# Patient Record
Sex: Female | Born: 2000 | Race: Black or African American | Hispanic: No | Marital: Single | Smoking: Never smoker
Health system: Southern US, Community
[De-identification: ages and names within clinical notes are randomized; demographics above are authoritative.]

## PROBLEM LIST (undated history)

## (undated) ENCOUNTER — Emergency Department (HOSPITAL_COMMUNITY): Admission: EM | Payer: Medicaid Other | Source: Home / Self Care

## (undated) ENCOUNTER — Ambulatory Visit (HOSPITAL_COMMUNITY): Discharge status: Absent without leave | Payer: Medicaid Other

## (undated) ENCOUNTER — Ambulatory Visit

## (undated) DIAGNOSIS — J45909 Unspecified asthma, uncomplicated: Secondary | ICD-10-CM

## (undated) DIAGNOSIS — N926 Irregular menstruation, unspecified: Secondary | ICD-10-CM

## (undated) DIAGNOSIS — L732 Hidradenitis suppurativa: Secondary | ICD-10-CM

## (undated) DIAGNOSIS — K219 Gastro-esophageal reflux disease without esophagitis: Secondary | ICD-10-CM

## (undated) HISTORY — PX: NO PAST SURGERIES: SHX2092

---
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
## 2021-01-01 ENCOUNTER — Telehealth: Payer: Self-pay | Admitting: Obstetrics and Gynecology

## 2021-01-01 NOTE — Telephone Encounter (Signed)
I called Judith [name] today at 3:23 PM and confirmed patient's identity using two patient identifiers. [place] results from earlier today were reviewed. First trimester warning signs reviewed. Patient voiced understanding and had no further questions.   No results found.  Duane Lope, NP 01/01/2021 3:23 PM

## 2021-01-07 ENCOUNTER — Emergency Department (HOSPITAL_COMMUNITY)
Admission: EM | Admit: 2021-01-07 | Discharge: 2021-01-07 | Disposition: A | Discharge status: Home / Self Care | Payer: Medicaid Other | Attending: Emergency Medicine | Admitting: Emergency Medicine

## 2021-01-07 ENCOUNTER — Encounter (HOSPITAL_COMMUNITY): Payer: Self-pay

## 2021-01-07 ENCOUNTER — Other Ambulatory Visit: Payer: Self-pay

## 2021-01-07 DIAGNOSIS — N9489 Other specified conditions associated with female genital organs and menstrual cycle: Secondary | ICD-10-CM | POA: Diagnosis not present

## 2021-01-07 DIAGNOSIS — Z3A09 9 weeks gestation of pregnancy: Secondary | ICD-10-CM | POA: Diagnosis not present

## 2021-01-07 DIAGNOSIS — O219 Vomiting of pregnancy, unspecified: Secondary | ICD-10-CM | POA: Diagnosis not present

## 2021-01-07 LAB — COMPREHENSIVE METABOLIC PANEL
ALT: 11 U/L (ref 0–44)
AST: 16 U/L (ref 15–41)
Albumin: 3.8 g/dL (ref 3.5–5.0)
Alkaline Phosphatase: 59 U/L (ref 38–126)
Anion gap: 15 (ref 5–15)
BUN: 12 mg/dL (ref 6–20)
CO2: 21 mmol/L — ABNORMAL LOW (ref 22–32)
Calcium: 9.9 mg/dL (ref 8.9–10.3)
Chloride: 102 mmol/L (ref 98–111)
Creatinine, Ser: 0.7 mg/dL (ref 0.44–1.00)
GFR, Estimated: >60 mL/min (ref >60)
Glucose, Bld: 86 mg/dL (ref 70–99)
Potassium: 3.5 mmol/L (ref 3.5–5.1)
Sodium: 138 mmol/L (ref 135–145)
Total Bilirubin: 0.9 mg/dL (ref 0.3–1.2)
Total Protein: 8.5 g/dL — ABNORMAL HIGH (ref 6.5–8.1)

## 2021-01-07 LAB — CBC WITH DIFFERENTIAL/PLATELET
Abs Immature Granulocytes: 0.02 10*3/uL (ref 0.00–0.07)
Basophils Absolute: 0 10*3/uL (ref 0.0–0.1)
Basophils Relative: 0 %
Eosinophils Absolute: 0 10*3/uL (ref 0.0–0.5)
Eosinophils Relative: 0 %
HCT: 38.9 % (ref 36.0–46.0)
Hemoglobin: 13.1 g/dL (ref 12.0–15.0)
Immature Granulocytes: 0 %
Lymphocytes Relative: 21 %
Lymphs Abs: 1.4 10*3/uL (ref 0.7–4.0)
MCH: 28.5 pg (ref 26.0–34.0)
MCHC: 33.7 g/dL (ref 30.0–36.0)
MCV: 84.7 fL (ref 80.0–100.0)
Monocytes Absolute: 0.6 10*3/uL (ref 0.1–1.0)
Monocytes Relative: 9 %
Neutro Abs: 4.5 10*3/uL (ref 1.7–7.7)
Neutrophils Relative %: 70 %
Platelets: 330 10*3/uL (ref 150–400)
RBC: 4.59 MIL/uL (ref 3.87–5.11)
RDW: 13.9 % (ref 11.5–15.5)
WBC: 6.5 10*3/uL (ref 4.0–10.5)
nRBC: 0 % (ref 0.0–0.2)

## 2021-01-07 LAB — HCG, QUANTITATIVE, PREGNANCY: hCG, Beta Chain, Quant, S: 305472 m[IU]/mL — ABNORMAL HIGH (ref <5)

## 2021-01-07 MED ORDER — ONDANSETRON HCL 4 MG/2ML IJ SOLN
4.0000 mg | Freq: Once | INTRAMUSCULAR | Status: AC
Start: 2021-01-07 — End: 2021-01-07
  Administered 2021-01-07: 4 mg via INTRAVENOUS
  Filled 2021-01-07: qty 2

## 2021-01-07 MED ORDER — DOXYLAMINE-PYRIDOXINE 10-10 MG PO TBEC: 10.0000 mg | DELAYED_RELEASE_TABLET | Freq: Every day | ORAL | 0 refills | Status: AC

## 2021-01-07 MED ORDER — SODIUM CHLORIDE 0.9 % IV BOLUS
1000.0000 mL | Freq: Once | INTRAVENOUS | Status: AC
  Administered 2021-01-07: 1000 mL via INTRAVENOUS

## 2021-01-07 MED ORDER — SODIUM CHLORIDE 0.9 % IV BOLUS
1000.0000 mL | Freq: Once | INTRAVENOUS | Status: AC
Start: 2021-01-07 — End: 2021-01-07
  Administered 2021-01-07: 1000 mL via INTRAVENOUS

## 2021-01-07 MED ORDER — SODIUM CHLORIDE 0.9 % IV SOLN
12.5000 mg | Freq: Four times a day (QID) | INTRAVENOUS | Status: DC | PRN
  Administered 2021-01-07: 12.5 mg via INTRAVENOUS
  Filled 2021-01-07: qty 0.5
  Filled 2021-01-07: qty 12.5

## 2021-01-07 NOTE — ED Provider Notes (Signed)
Emergency Medicine Provider Triage Evaluation Note  Judith [name] , a 20 y.o. G1 T0 P0 A0 L0 female who is [redacted] weeks pregnant was evaluated in triage.  Pt complains of intermittent nausea and vomiting.  She states that her symptoms started about 2 weeks ago.  States she has had very poor p.o. intake due to this and has been losing weight.  Reports intermittent pelvic cramping but denies any significant pelvic pain.  No vaginal discharge or vaginal bleeding.  Patient states that she had an ultrasound last week but otherwise does not have an OB/GYN.  Findings of her ultrasound as noted below.  IMPRESSION: Single living IUP with estimated gestational age of [redacted] weeks 1 day, and [place] EDC of 08/11/2021.   Small subchorionic hemorrhage.  Physical Exam  BP 118/85 (BP Location: Left Arm)   Pulse (!) 112   Temp 99 F (37.2 C) (Oral)   Resp 18   SpO2 100%  Gen:   Awake, no distress   Resp:  Normal effort  MSK:   Moves extremities without difficulty  Other:    Medical Decision Making  Medically screening exam initiated at 12:50 PM.  Appropriate orders placed.  Cassidie Veiga was informed that the remainder of the evaluation will be completed by another provider, this initial triage assessment does not replace that evaluation, and the importance of remaining in the ED until their evaluation is complete.   Placido Sou, PA-C 01/07/21 1252    Gerhard Munch, MD 01/08/21 1450

## 2021-01-07 NOTE — ED Notes (Signed)
Pt given 3 cans of ginger ale without emesis. Pt given [place] sandwich for PO challenge post- phenergan administration

## 2021-01-07 NOTE — ED Notes (Signed)
Pt denies N/V after consumption of [place] sandwich

## 2021-01-07 NOTE — ED Provider Notes (Signed)
[facility]-EMERGENCY DEPT Provider Note   CSN: [id number] Arrival date & time: 01/07/21  1229     History Chief Complaint  Patient presents with   Emesis    Judith [name] is a 20 y.o. female with a past medical history significant for hidradenitis suppurativa who presents to the ED due to nausea and vomiting x2 weeks.  Patient is roughly [redacted] weeks pregnant (per patient) with her first pregnancy.  Patient states she is unable to tolerate any p.o.  She admits to numerous episodes of nonbilious emesis daily with specks of blood.  Denies associated abdominal pain.  No vaginal bleeding.  She had a ultrasound performed on 12/31/2020 which confirmed IUP with estimated gestational age of [redacted] weeks 1 day. EDD 08/11/2021.  It also demonstrated a small subchorionic hemorrhage.  Patient denies urinary symptoms.  No diarrhea.  She has not tried thing for symptoms.  History obtained from patient and past medical records. No interpreter used during encounter.       Past Medical History:  Diagnosis Date   Hidradenitis suppurativa    Irregular periods     There are no problems to display for this patient.   History reviewed. No pertinent surgical history.   OB History     Gravida  1   Para      Term      Preterm      AB      Living         SAB      IAB      Ectopic      Multiple      Live Births              Family History  Problem Relation Age of Onset   Asthma Mother    Diabetes Mother    Kidney failure Father     Social History   Tobacco Use   Smoking status: Never   Smokeless tobacco: Never  Vaping Use   Vaping Use: Never used  Substance Use Topics   Alcohol use: No   Drug use: No    Home Medications Prior to Admission medications   Medication Sig Start Date End Date Taking? Authorizing Provider  Doxylamine-Pyridoxine 10-10 MG TBEC Take 10 mg by mouth at bedtime. 01/07/21 02/06/21 Yes Jaquana Geiger, Merla Riches, PA-C  cephALEXin (KEFLEX)  500 MG capsule Take 1 capsule (500 mg total) by mouth 2 (two) times daily. 12/14/20   Mardella Layman, MD    Allergies    Patient has no known allergies.  Review of Systems   Review of Systems  Constitutional:  Negative for chills and fever.  Respiratory:  Negative for shortness of breath.   Cardiovascular:  Negative for chest pain.  Gastrointestinal:  Positive for nausea and vomiting. Negative for abdominal pain and diarrhea.  Genitourinary:  Negative for dysuria, vaginal bleeding and vaginal discharge.  All other systems reviewed and are negative.  Physical Exam Updated Vital Signs BP (!) 99/58 (BP Location: Left Arm)   Pulse 80   Temp 99 F (37.2 C) (Oral)   Resp 16   Ht 4\' 11"  (1.499 m)   Wt 65.8 kg   LMP 11/11/2020 (Exact Date)   SpO2 100%   BMI 29.29 kg/m   Physical Exam Vitals and nursing note reviewed.  Constitutional:      General: She is not in acute distress.    Appearance: She is not ill-appearing.  HENT:     Head: Normocephalic.  Eyes:     Pupils: Pupils are equal, round, and reactive to light.  Cardiovascular:     Rate and Rhythm: Normal rate and regular rhythm.     Pulses: Normal pulses.     Heart sounds: Normal heart sounds. No murmur heard.   No friction rub. No gallop.  Pulmonary:     Effort: Pulmonary effort is normal.     Breath sounds: Normal breath sounds.  Abdominal:     General: Abdomen is flat. There is no distension.     Palpations: Abdomen is soft.     Tenderness: There is no abdominal tenderness. There is no guarding or rebound.     Comments: Abdomen soft, nondistended, nontender to palpation in all quadrants without guarding or peritoneal signs. No rebound.   Musculoskeletal:        General: Normal range of motion.     Cervical back: Neck supple.  Skin:    General: Skin is warm and dry.  Neurological:     General: No focal deficit present.     Mental Status: She is alert.  Psychiatric:        Mood and Affect: Mood normal.         Behavior: Behavior normal.    ED Results / Procedures / Treatments   Labs (all labs ordered are listed, but only abnormal results are displayed) Labs Reviewed  COMPREHENSIVE METABOLIC PANEL - Abnormal; Notable for the following components:      Result Value   CO2 21 (*)    Total Protein 8.5 (*)    All other components within normal limits  HCG, QUANTITATIVE, PREGNANCY - Abnormal; Notable for the following components:   hCG, Beta Chain, Quant, [place] [phone] (*)    All other components within normal limits  CBC WITH DIFFERENTIAL/PLATELET  URINALYSIS, ROUTINE W REFLEX MICROSCOPIC    EKG None  Radiology No results found.  Procedures Procedures   Medications Ordered in ED Medications  promethazine (PHENERGAN) 12.5 mg in sodium chloride 0.9 % 50 mL IVPB (0 mg Intravenous Stopped 01/07/21 1841)  sodium chloride 0.9 % bolus 1,000 mL (0 mLs Intravenous Stopped 01/07/21 1801)  ondansetron (ZOFRAN) injection 4 mg (4 mg Intravenous Given 01/07/21 1546)  sodium chloride 0.9 % bolus 1,000 mL (1,000 mLs Intravenous New Bag/Given 01/07/21 1804)    ED Course  I have reviewed the triage vital signs and the nursing notes.  Pertinent labs & imaging results that were available during my care of the patient were reviewed by me and considered in my medical decision making (see chart for details).  Clinical Course as of 01/07/21 1849  Tue Jan 07, 2021  1535 HCG, Newman Nickels(!): [phone] [CA]    Clinical Course User Index [CA] Mannie Stabile, PA-C   MDM Rules/Calculators/A&P                           20 year old G1P0 female at roughly [redacted] weeks gestation per [place] presents to the ED due to nausea vomiting x2 weeks.  No abdominal pain.  Patient denies vaginal bleeding.  Upon arrival, patient afebrile, tachycardic at 112 with otherwise unremarkable vitals.  During my evaluation, patient's heart rate in the 90s.  Patient nontoxic-appearing.  Physical exam reassuring.  Abdomen soft, nondistended,  nontender.  Low suspicion for acute abdomen.  Labs ordered in triage.  IV fluids and Zofran given.  CBC unremarkable with no leukocytosis and normal hemoglobin.  CMP reassuring  with normal renal function and no major electrolyte derangements.  Hcg [id number] consistent with pregnancy. Unfortunately, urine spilled out in lab and unable to obtain anymore.  Patient denies any urinary symptoms.  Upon reassessment, patient admits to improvement in symptoms.  Patient eating a [place] sandwich at bedside without difficulty.  Patient discharged with symptomatic treatment.  Advised patient to establish care with an OB/GYN for further evaluation. Strict ED precautions discussed with patient. Patient states understanding and agrees to plan. Patient discharged home in no acute distress and stable vitals  Final Clinical Impression(s) / ED Diagnoses Final diagnoses:  Nausea and vomiting in pregnancy  [redacted] weeks gestation of pregnancy    Rx / DC Orders ED Discharge Orders          Ordered    Doxylamine-Pyridoxine 10-10 MG TBEC  Daily at bedtime        01/07/21 1807             Jesusita Oka 01/07/21 1849    Jacalyn Lefevre, MD 01/07/21 2153

## 2021-01-07 NOTE — Discharge Instructions (Addendum)
It was a pleasure taking care of you today.  As discussed, I am sending you home with nausea medication.  Take nightly.  I have included the number of the OB/GYN.  Please call tomorrow to schedule an appointment to establish care.  Return to the ER for new or worsening symptoms.

## 2021-01-07 NOTE — ED Triage Notes (Signed)
Patient reports that she is [redacted] weeks pregnant and has been vomiting for the past 2 weeks daily throughout each day.

## 2021-07-18 ENCOUNTER — Other Ambulatory Visit: Payer: Self-pay

## 2021-07-18 ENCOUNTER — Encounter (HOSPITAL_COMMUNITY): Payer: Self-pay | Admitting: Emergency Medicine

## 2021-07-18 ENCOUNTER — Ambulatory Visit (HOSPITAL_COMMUNITY)
Admission: EM | Admit: 2021-07-18 | Discharge: 2021-07-18 | Disposition: A | Discharge status: Home / Self Care | Payer: Medicaid Other | Attending: Nurse Practitioner | Admitting: Nurse Practitioner

## 2021-07-18 DIAGNOSIS — R350 Frequency of micturition: Secondary | ICD-10-CM | POA: Insufficient documentation

## 2021-07-18 DIAGNOSIS — R1011 Right upper quadrant pain: Secondary | ICD-10-CM | POA: Insufficient documentation

## 2021-07-18 LAB — POCT URINALYSIS DIPSTICK, ED / UC
Bilirubin Urine: NEGATIVE
Glucose, UA: NEGATIVE mg/dL
Ketones, ur: NEGATIVE mg/dL
Leukocytes,Ua: NEGATIVE
Nitrite: NEGATIVE
Protein, ur: NEGATIVE mg/dL
Specific Gravity, Urine: 1.02 (ref 1.005–1.030)
Urobilinogen, UA: 0.2 mg/dL (ref 0.0–1.0)
pH: 7 (ref 5.0–8.0)

## 2021-07-18 LAB — POC URINE PREG, ED: Preg Test, Ur: NEGATIVE

## 2021-07-18 MED ORDER — IBUPROFEN 600 MG PO TABS: 600.0000 mg | ORAL_TABLET | Freq: Three times a day (TID) | ORAL | 0 refills | Status: AC | PRN

## 2021-07-18 NOTE — ED Triage Notes (Signed)
Pt states she recently came off her menstrual cycle and now has abdominal cramping similar to menstrual pains. States she now has urinary frequency and increased pressure in the bladder x 3days.  ?

## 2021-07-18 NOTE — ED Provider Notes (Signed)
?[facility] ? ? ? ?CSN: [id number] ?Arrival date & time: 07/18/21  1354 ? ? ?  ? ?History   ?Chief Complaint ?Chief Complaint  ?Patient presents with  ? Abdominal Pain  ? Urinary Frequency  ? ? ?HPI ?Judith [name] is a 21 y.o. female.  ? ?The patient is a 21 year old female who presents for urinary symptoms and abdominal cramping.  The patient states that ramping started about 3 days ago.  Her last menstrual cycle was on 07/09/2021, lasted about 4 days, and she reports it was normal.  Over the past 24 hours, she has developed urinary frequency, and suprapubic pressure with urination.  She also complains of abdominal cramping.  She states the cramping feels like "menstrual" cramps.  She states that they have been pretty frequent, and have not improved with the use of ibuprofen.  She denies fever, chills, low back pain, urgency, hesitancy, or hematuria.  She would also like to have STI testing done today.  She is sexually active with 1 female in the past 90 days, 0% condom use.  She denies any previous history of STI. ? ? ?Abdominal Pain ?Pain location:  RUQ ?Pain quality: cramping   ?Pain radiates to:  Does not radiate ?Timing:  Intermittent ?Progression:  Waxing and waning ?Context: recent sexual activity   ?Context: not diet changes   ?Relieved by:  Nothing ?Associated symptoms: no chills, no constipation, no nausea, no vaginal bleeding and no vaginal discharge   ?Urinary Frequency ?Associated symptoms include abdominal pain.  ? ?Past Medical History:  ?Diagnosis Date  ? Hidradenitis suppurativa   ? Irregular periods   ? ? ?There are no problems to display for this patient. ? ? ?History reviewed. No pertinent surgical history. ? ?OB History   ? ? Gravida  ?1  ? Para  ?   ? Term  ?   ? Preterm  ?   ? AB  ?   ? Living  ?   ?  ? ? SAB  ?   ? IAB  ?   ? Ectopic  ?   ? Multiple  ?   ? Live Births  ?   ?   ?  ?  ? ? ? ?Home Medications   ? ?Prior to Admission medications   ?Medication Sig Start Date End Date  Taking? Authorizing Provider  ?ibuprofen (ADVIL) 600 MG tablet Take 1 tablet (600 mg total) by mouth every 8 (eight) hours as needed for up to 10 days. 07/18/21 07/28/21 Yes Kaveri Perras-Warren, Alda Lea, NP  ?cephALEXin (KEFLEX) 500 MG capsule Take 1 capsule (500 mg total) by mouth 2 (two) times daily. 12/14/20   Vanessa Kick, MD  ? ? ?Family History ?Family History  ?Problem Relation Age of Onset  ? Asthma Mother   ? Diabetes Mother   ? Kidney failure Father   ? ? ?Social History ?Social History  ? ?Tobacco Use  ? Smoking status: Never  ? Smokeless tobacco: Never  ?Vaping Use  ? Vaping Use: Never used  ?Substance Use Topics  ? Alcohol use: No  ? Drug use: No  ? ? ? ?Allergies   ?Patient has no known allergies. ? ? ?Review of Systems ?Review of Systems  ?Constitutional: Negative.  Negative for chills.  ?Gastrointestinal:  Positive for abdominal pain. Negative for constipation and nausea.  ?Genitourinary:  Positive for frequency. Negative for vaginal bleeding and vaginal discharge.  ?Skin: Negative.   ?Psychiatric/Behavioral: Negative.    ? ? ?Physical Exam ?Triage  Vital Signs ?ED Triage Vitals  ?Enc Vitals Group  ?   BP 07/18/21 1435 117/77  ?   Pulse Rate 07/18/21 1435 89  ?   Resp 07/18/21 1435 16  ?   Temp 07/18/21 1435 98.4 ?F (36.9 ?C)  ?   Temp Source 07/18/21 1435 Oral  ?   SpO2 07/18/21 1435 97 %  ?   Weight 07/18/21 1434 145 lb 1 oz (65.8 kg)  ?   Height 07/18/21 1434 4\' 11"  (1.499 m)  ?   Head Circumference --   ?   Peak Flow --   ?   Pain Score 07/18/21 1433 7  ?   Pain Loc --   ?   Pain Edu? --   ?   Excl. in [place]? --   ? ?No data found. ? ?Updated Vital Signs ?BP 117/77 (BP Location: Right Arm)   Pulse 89   Temp 98.4 ?F (36.9 ?C) (Oral)   Resp 16   Ht 4\' 11"  (1.499 m)   Wt 145 lb 1 oz (65.8 kg)   LMP 07/09/2021 (Exact Date)   SpO2 97%   Breastfeeding Unknown   BMI 29.30 kg/m?  ? ?Visual Acuity ?Right Eye Distance:   ?Left Eye Distance:   ?Bilateral Distance:   ? ?Right Eye Near:   ?Left Eye Near:     ?Bilateral Near:    ? ?Physical Exam ?Vitals reviewed.  ?Constitutional:   ?   Appearance: She is well-developed and normal weight.  ?HENT:  ?   Head: Normocephalic and atraumatic.  ?Eyes:  ?   Extraocular Movements: Extraocular movements intact.  ?   Pupils: Pupils are equal, round, and reactive to light.  ?Cardiovascular:  ?   Rate and Rhythm: Normal rate and regular rhythm.  ?   Heart sounds: Normal heart sounds.  ?Pulmonary:  ?   Effort: Pulmonary effort is normal.  ?   Breath sounds: Normal breath sounds.  ?Abdominal:  ?   General: Bowel sounds are normal. There is no distension.  ?   Palpations: Abdomen is soft.  ?   Tenderness: There is abdominal tenderness in the right upper quadrant. There is no right CVA tenderness, left CVA tenderness, guarding or rebound.  ?Skin: ?   General: Skin is warm and dry.  ?Neurological:  ?   Mental Status: She is alert and oriented to person, place, and time.  ?Psychiatric:     ?   Mood and Affect: Mood normal.     ?   Behavior: Behavior normal.  ? ? ? ?UC Treatments / Results  ?Labs ?(all labs ordered are listed, but only abnormal results are displayed) ?Labs Reviewed  ?POCT URINALYSIS DIPSTICK, ED / UC - Abnormal; Notable for the following components:  ?    Result Value  ? Hgb urine dipstick SMALL (*)   ? All other components within normal limits  ?URINE CULTURE  ?POC URINE PREG, ED  ?CERVICOVAGINAL ANCILLARY ONLY  ? ? ?EKG ? ? ?Radiology ?No results found. ? ?Procedures ?Procedures (including critical care time) ? ?Medications Ordered in UC ?Medications - No data to display ? ?Initial Impression / Assessment and Plan / UC Course  ?I have reviewed the triage vital signs and the nursing notes. ? ?Pertinent labs & imaging results that were available during my care of the patient were reviewed by me and considered in my medical decision making (see chart for details). ? ?The patient presents with urinary symptoms and abdominal cramping over  the past 3 days.  She states her  period was last on 07/09/2021.  Urinalysis shows small amount of blood, which is most likely from her recent menstruation.  Urine pregnancy was normal.  Will await cytology results to determine if there is any correlation of her symptoms with any STI/STD.  In the interim, I am also going to go ahead and order a urine culture for her symptoms.  There is no concern for acute abdomen at this time as she has no fever, vss, is in no distress, and has no CVA or rebound tenderness.  She does have some right upper quadrant tenderness, but this does not correlate with her symptoms.  Discussed with patient that she can use over-the-counter Tylenol extra strength for her symptoms or ibuprofen 600 mg.  I will provide a prescription for ibuprofen to see if this helps with her abdominal cramping.  Encouraged the patient to increase fluids, and to consider some form of birth control.  Patient will be contacted if cytology results are abnormal.  Patient to follow-up if symptoms do not improve. ?Final Clinical Impressions(s) / UC Diagnoses  ? ?Final diagnoses:  ?Right upper quadrant abdominal pain  ?Urinary frequency  ? ? ? ?Discharge Instructions   ? ?  ?Your urinalysis and urine pregnancy test are negative today.  Will await the results of your cytology swab, if your results are positive, you will be contacted.  Also awaiting the results of your urine culture, if that is positive, you will also be contacted regarding treatment. ?May take ibuprofen as needed for pain, take medication with food and water. ?Increase fluids and get plenty of rest. ?If your abdominal pain worsens, you develop fever, chills, nausea, vomiting, or other concerns, I recommend you go to the emergency department immediately for further evaluation. ?Follow-up as needed. ? ? ? ? ?ED Prescriptions   ? ? Medication Sig Dispense Auth. Provider  ? ibuprofen (ADVIL) 600 MG tablet Take 1 tablet (600 mg total) by mouth every 8 (eight) hours as needed for up to 10 days.  30 tablet Kolt Mcwhirter-Warren, Alda Lea, NP  ? ?  ? ?PDMP not reviewed this encounter. ?  ?Tish Men, NP ?07/18/21 1537 ? ?

## 2021-07-18 NOTE — Discharge Instructions (Addendum)
Your urinalysis and urine pregnancy test are negative today.  Will await the results of your cytology swab, if your results are positive, you will be contacted.  Also awaiting the results of your urine culture, if that is positive, you will also be contacted regarding treatment. ?May take ibuprofen as needed for pain, take medication with food and water. ?Increase fluids and get plenty of rest. ?If your abdominal pain worsens, you develop fever, chills, nausea, vomiting, or other concerns, I recommend you go to the emergency department immediately for further evaluation. ?Follow-up as needed. ?

## 2021-07-20 ENCOUNTER — Telehealth (HOSPITAL_COMMUNITY): Payer: Self-pay | Admitting: Nurse Practitioner

## 2021-07-20 NOTE — Telephone Encounter (Signed)
Patient presented today for continued urinary symptoms. Patient was seen on 07/18/21 for urinary symptoms but urinalysis was negative. Culture was collected and resulted positive. Will send prescription for Cystitis. ?

## 2021-07-21 ENCOUNTER — Telehealth (HOSPITAL_COMMUNITY): Payer: Self-pay | Admitting: Emergency Medicine

## 2021-07-21 LAB — URINE CULTURE: Culture: >=100000 — AB

## 2021-07-21 LAB — CERVICOVAGINAL ANCILLARY ONLY
Bacterial Vaginitis (gardnerella): POSITIVE — AB
Candida Glabrata: NEGATIVE
Candida Vaginitis: NEGATIVE
Chlamydia: NEGATIVE
Comment: NEGATIVE
Comment: NEGATIVE
Comment: NEGATIVE
Comment: NEGATIVE
Comment: NEGATIVE
Comment: NORMAL
Neisseria Gonorrhea: NEGATIVE
Trichomonas: NEGATIVE

## 2021-07-21 MED ORDER — METRONIDAZOLE 500 MG PO TABS: 500.0000 mg | ORAL_TABLET | Freq: Two times a day (BID) | ORAL | 0 refills | Status: DC

## 2021-07-21 MED ORDER — NITROFURANTOIN MONOHYD MACRO 100 MG PO CAPS: 100.0000 mg | ORAL_CAPSULE | Freq: Two times a day (BID) | ORAL | 0 refills | Status: DC

## 2021-12-05 ENCOUNTER — Encounter (HOSPITAL_COMMUNITY): Payer: Self-pay

## 2021-12-05 ENCOUNTER — Ambulatory Visit (HOSPITAL_COMMUNITY)
Admission: EM | Admit: 2021-12-05 | Discharge: 2021-12-05 | Disposition: A | Discharge status: Home / Self Care | Payer: Medicaid Other | Attending: Family Medicine | Admitting: Family Medicine

## 2021-12-05 DIAGNOSIS — K529 Noninfective gastroenteritis and colitis, unspecified: Secondary | ICD-10-CM | POA: Diagnosis not present

## 2021-12-05 LAB — HCG, SERUM, QUALITATIVE: Preg, Serum: NEGATIVE

## 2021-12-05 LAB — POC URINE PREG, ED: Preg Test, Ur: NEGATIVE

## 2021-12-05 MED ORDER — ONDANSETRON 4 MG PO TBDP: 4.0000 mg | ORAL_TABLET | Freq: Three times a day (TID) | ORAL | 0 refills | Status: DC | PRN

## 2021-12-05 NOTE — Discharge Instructions (Addendum)
The urine pregnancy test was negative. This could be a stomach bug/virus. If symptoms continue in the next 7 days, please get seen again.  Staff will notify you if the pregnancy test in the blood is positive.  Ondansetron dissolved in the mouth every 8 hours as needed for nausea or vomiting. Clear liquids and bland things to eat.

## 2021-12-05 NOTE — ED Provider Notes (Addendum)
[facility]    CSN: [id number] Arrival date & time: 12/05/21  0801      History   Chief Complaint Chief Complaint  Patient presents with   Possible Pregnancy   Emesis   Abdominal Cramping    HPI Judith [name] is a 21 y.o. female.    Possible Pregnancy  Emesis Abdominal Cramping   Here for n/v that began about 4 days ago.  Yesterday she only throat wants, but she is thrown up a few times this morning.  She has had no diarrhea.  No fever or chills or upper respiratory symptoms.  She has had some back pain.  Last menstrual cycle was actually about 2 weeks ago.  She did a home pregnancy test yesterday and first it was negative, but then after about 15 minutes it showed positive.  The symptoms are how she presented when she had her last pregnancy  Past Medical History:  Diagnosis Date   Hidradenitis suppurativa    Irregular periods     There are no problems to display for this patient.   History reviewed. No pertinent surgical history.  OB History     Gravida  1   Para      Term      Preterm      AB      Living         SAB      IAB      Ectopic      Multiple      Live Births               Home Medications    Prior to Admission medications   Medication Sig Start Date End Date Taking? Authorizing Provider  ondansetron (ZOFRAN-ODT) 4 MG disintegrating tablet Take 1 tablet (4 mg total) by mouth every 8 (eight) hours as needed for nausea or vomiting. 12/05/21  Yes Lemont Sitzmann, Janace Aris, MD    Family History Family History  Problem Relation Age of Onset   Asthma Mother    Diabetes Mother    Kidney failure Father     Social History Social History   Tobacco Use   Smoking status: Never   Smokeless tobacco: Never  Vaping Use   Vaping Use: Never used  Substance Use Topics   Alcohol use: No   Drug use: No     Allergies   Patient has no known allergies.   Review of Systems Review of Systems  Gastrointestinal:   Positive for vomiting.     Physical Exam Triage Vital Signs ED Triage Vitals  Enc Vitals Group     BP 12/05/21 0812 130/84     Pulse Rate 12/05/21 0812 (!) 108     Resp 12/05/21 0812 16     Temp 12/05/21 0812 98.9 F (37.2 C)     Temp Source 12/05/21 0812 Oral     SpO2 12/05/21 0812 98 %     Weight 12/05/21 0813 138 lb (62.6 kg)     Height 12/05/21 0813 4\' 11"  (1.499 m)     Head Circumference --      Peak Flow --      Pain Score 12/05/21 0813 0     Pain Loc --      Pain Edu? --      Excl. in GC? --    No data found.  Updated Vital Signs BP 130/84 (BP Location: Right Arm)   Pulse (!) 108   Temp 98.9 F (37.2 C) (  Oral)   Resp 16   Ht 4\' 11"  (1.499 m)   Wt 62.6 kg   LMP 11/21/2021 (Within Days)   SpO2 98%   Breastfeeding No   BMI 27.87 kg/m   Visual Acuity Right Eye Distance:   Left Eye Distance:   Bilateral Distance:    Right Eye Near:   Left Eye Near:    Bilateral Near:     Physical Exam Vitals reviewed.  Constitutional:      General: She is not in acute distress.    Appearance: She is normal weight. She is not ill-appearing, toxic-appearing or diaphoretic.  HENT:     Mouth/Throat:     Mouth: Mucous membranes are moist.  Eyes:     Extraocular Movements: Extraocular movements intact.     Conjunctiva/sclera: Conjunctivae normal.     Pupils: Pupils are equal, round, and reactive to light.  Cardiovascular:     Rate and Rhythm: Normal rate and regular rhythm.     Heart sounds: No murmur heard. Pulmonary:     Effort: Pulmonary effort is normal.     Breath sounds: Normal breath sounds.  Abdominal:     General: There is no distension.     Palpations: Abdomen is soft. There is no mass.     Tenderness: There is abdominal tenderness (epigastric). There is no guarding.  Musculoskeletal:     Cervical back: Neck supple.  Lymphadenopathy:     Cervical: No cervical adenopathy.  Skin:    Capillary Refill: Capillary refill takes less than 2 seconds.      Coloration: Skin is not jaundiced or pale.  Neurological:     General: No focal deficit present.     Mental Status: She is alert and oriented to person, place, and time.  Psychiatric:        Behavior: Behavior normal.      UC Treatments / Results  Labs (all labs ordered are listed, but only abnormal results are displayed) Labs Reviewed  HCG, SERUM, QUALITATIVE  POC URINE PREG, ED    EKG   Radiology No results found.  Procedures Procedures (including critical care time)  Medications Ordered in UC Medications - No data to display  Initial Impression / Assessment and Plan / UC Course  I have reviewed the triage vital signs and the nursing notes.  Pertinent labs & imaging results that were available during my care of the patient were reviewed by me and considered in my medical decision making (see chart for details).     UPT is negative here.  We will supply Zofran for the nausea and vomiting.  She wanted to do a serum pregnancy test to confirm Final Clinical Impressions(s) / UC Diagnoses   Final diagnoses:  Gastroenteritis     Discharge Instructions      The urine pregnancy test was negative. This could be a stomach bug/virus. If symptoms continue in the next 7 days, please get seen again.  Staff will notify you if the pregnancy test in the blood is positive.  Ondansetron dissolved in the mouth every 8 hours as needed for nausea or vomiting. Clear liquids and bland things to eat.      ED Prescriptions     Medication Sig Dispense Auth. Provider   ondansetron (ZOFRAN-ODT) 4 MG disintegrating tablet Take 1 tablet (4 mg total) by mouth every 8 (eight) hours as needed for nausea or vomiting. 10 tablet 11/23/2021 Marlinda Mike, MD      PDMP not reviewed this encounter.  Zenia Resides, MD 12/05/21 Theodis Blaze    Zenia Resides, MD 12/05/21 [phone]

## 2021-12-05 NOTE — ED Triage Notes (Signed)
Patient states for the last 4 days she is having nausea, vomiting, low back pain, and abdominal cramping.   States she thinks she may be pregnant. Has been pregnant before and states this is how her last pregnancy started. Patient took a home test that was negative but positive after it sat for 10 minutes.

## 2021-12-14 ENCOUNTER — Ambulatory Visit (HOSPITAL_COMMUNITY)
Admission: EM | Admit: 2021-12-14 | Discharge: 2021-12-14 | Disposition: A | Discharge status: Home / Self Care | Payer: Medicaid Other | Attending: Internal Medicine | Admitting: Internal Medicine

## 2021-12-14 ENCOUNTER — Encounter (HOSPITAL_COMMUNITY): Payer: Self-pay

## 2021-12-14 DIAGNOSIS — R3915 Urgency of urination: Secondary | ICD-10-CM | POA: Diagnosis not present

## 2021-12-14 DIAGNOSIS — N912 Amenorrhea, unspecified: Secondary | ICD-10-CM | POA: Diagnosis not present

## 2021-12-14 DIAGNOSIS — Z113 Encounter for screening for infections with a predominantly sexual mode of transmission: Secondary | ICD-10-CM | POA: Insufficient documentation

## 2021-12-14 DIAGNOSIS — Z3202 Encounter for pregnancy test, result negative: Secondary | ICD-10-CM | POA: Insufficient documentation

## 2021-12-14 DIAGNOSIS — N898 Other specified noninflammatory disorders of vagina: Secondary | ICD-10-CM | POA: Diagnosis not present

## 2021-12-14 LAB — POCT URINALYSIS DIPSTICK, ED / UC
Bilirubin Urine: NEGATIVE
Glucose, UA: NEGATIVE mg/dL
Ketones, ur: NEGATIVE mg/dL
Nitrite: NEGATIVE
Protein, ur: 30 mg/dL — AB
Specific Gravity, Urine: 1.025 (ref 1.005–1.030)
Urobilinogen, UA: 0.2 mg/dL (ref 0.0–1.0)
pH: 6 (ref 5.0–8.0)

## 2021-12-14 LAB — HCG, QUANTITATIVE, PREGNANCY: hCG, Beta Chain, Quant, S: 1 m[IU]/mL (ref <5)

## 2021-12-14 LAB — POC URINE PREG, ED: Preg Test, Ur: NEGATIVE

## 2021-12-14 MED ORDER — CEPHALEXIN 500 MG PO CAPS
500.0000 mg | ORAL_CAPSULE | Freq: Four times a day (QID) | ORAL | 0 refills | Status: DC
Start: 2021-12-14 — End: 2022-01-27

## 2021-12-14 NOTE — ED Triage Notes (Signed)
Pt states dysuria for several days. Pt is requesting a urine pregnancy test. LMP:11/10/2021

## 2021-12-14 NOTE — ED Provider Notes (Addendum)
[facility]    CSN: [id number] Arrival date & time: 12/14/21  1010      History   Chief Complaint Chief Complaint  Patient presents with   Urinary Tract Infection   Possible Pregnancy    HPI Judith [name] is a 21 y.o. female.   Patient presents with urinary urgency and frequency that started yesterday.  Patient also reports vaginal discharge that she is not sure the color of that started about a week ago.  Denies abdominal pain, pelvic pain, hematuria, vaginal bleeding, back pain, fever.  Patient is concerned for pregnancy given that her last menstrual cycle was 11/10/2021.  Denies any exposure to STD but patient is open to STD testing.  Patient presented a few weeks prior and had negative urine pregnancy test and quantitative hCG at that time.   Urinary Tract Infection Possible Pregnancy    Past Medical History:  Diagnosis Date   Hidradenitis suppurativa    Irregular periods     There are no problems to display for this patient.   History reviewed. No pertinent surgical history.  OB History     Gravida  1   Para      Term      Preterm      AB      Living         SAB      IAB      Ectopic      Multiple      Live Births               Home Medications    Prior to Admission medications   Medication Sig Start Date End Date Taking? Authorizing Provider  cephALEXin (KEFLEX) 500 MG capsule Take 1 capsule (500 mg total) by mouth 4 (four) times daily. 12/14/21  Yes Chett Taniguchi, Rolly Salter E, FNP  ondansetron (ZOFRAN-ODT) 4 MG disintegrating tablet Take 1 tablet (4 mg total) by mouth every 8 (eight) hours as needed for nausea or vomiting. 12/05/21   Zenia Resides, MD    Family History Family History  Problem Relation Age of Onset   Asthma Mother    Diabetes Mother    Kidney failure Father     Social History Social History   Tobacco Use   Smoking status: Never   Smokeless tobacco: Never  Vaping Use   Vaping Use: Never used   Substance Use Topics   Alcohol use: No   Drug use: No     Allergies   Patient has no known allergies.   Review of Systems Review of Systems Per HPI  Physical Exam Triage Vital Signs ED Triage Vitals [12/14/21 1107]  Enc Vitals Group     BP 122/81     Pulse Rate (!) 104     Resp 18     Temp 98.4 F (36.9 C)     Temp Source Oral     SpO2 97 %     Weight      Height      Head Circumference      Peak Flow      Pain Score      Pain Loc      Pain Edu?      Excl. in GC?    No data found.  Updated Vital Signs BP 122/81 (BP Location: Left Arm)   Pulse (!) 104   Temp 98.4 F (36.9 C) (Oral)   Resp 18   LMP 11/21/2021 (Within Days)   SpO2 97%  Visual Acuity Right Eye Distance:   Left Eye Distance:   Bilateral Distance:    Right Eye Near:   Left Eye Near:    Bilateral Near:     Physical Exam Constitutional:      General: She is not in acute distress.    Appearance: Normal appearance. She is not toxic-appearing or diaphoretic.  HENT:     Head: Normocephalic and atraumatic.  Eyes:     Extraocular Movements: Extraocular movements intact.     Conjunctiva/sclera: Conjunctivae normal.  Cardiovascular:     Rate and Rhythm: Normal rate and regular rhythm.     Pulses: Normal pulses.     Heart sounds: Normal heart sounds.  Pulmonary:     Effort: Pulmonary effort is normal. No respiratory distress.     Breath sounds: Normal breath sounds.  Abdominal:     General: Bowel sounds are normal. There is no distension.     Palpations: Abdomen is soft.     Tenderness: There is no abdominal tenderness.  Genitourinary:    Comments: Deferred with shared decision making.  Self swab performed. Neurological:     General: No focal deficit present.     Mental Status: She is alert and oriented to person, place, and time. Mental status is at baseline.  Psychiatric:        Mood and Affect: Mood normal.        Behavior: Behavior normal.        Thought Content: Thought content  normal.        Judgment: Judgment normal.      UC Treatments / Results  Labs (all labs ordered are listed, but only abnormal results are displayed) Labs Reviewed  POCT URINALYSIS DIPSTICK, ED / UC - Abnormal; Notable for the following components:      Result Value   Hgb urine dipstick LARGE (*)    Protein, ur 30 (*)    Leukocytes,Ua MODERATE (*)    All other components within normal limits  URINE CULTURE  HCG, QUANTITATIVE, PREGNANCY  POC URINE PREG, ED  CERVICOVAGINAL ANCILLARY ONLY    EKG   Radiology No results found.  Procedures Procedures (including critical care time)  Medications Ordered in UC Medications - No data to display  Initial Impression / Assessment and Plan / UC Course  I have reviewed the triage vital signs and the nursing notes.  Pertinent labs & imaging results that were available during my care of the patient were reviewed by me and considered in my medical decision making (see chart for details).     Urinalysis showing leukocytes that are present which could indicate urinary tract infection.  Although, patient is having vaginal discharge and this could be present for vaginitis as well.  Will send urine culture and cervicovaginal swab to confirm etiology.  Will opt to treat with cephalexin antibiotic given urinary symptoms are present.  Patient to refrain from sexual activity until test results and treatment are complete.  Urine pregnancy was negative.  Suspect that hematuria could be present due to vaginitis, although will obtain another quantitative hCG and patient was encouraged to follow-up with her established gynecologist for further evaluation and management if amenorrhea continues.  Patient verbalized understanding and was agreeable with plan.  It appears that protein in urine is related to UTI versus vaginitis as patient has had protein in the urine in the past with similar symptoms.  It could also be due to low p.o. intake.  No concern for  worrisome etiology  for this at this time. Final Clinical Impressions(s) / UC Diagnoses   Final diagnoses:  Urinary urgency  Vaginal discharge  Screening examination for venereal disease  Amenorrhea  Urine pregnancy test negative     Discharge Instructions      You are being treated for urinary tract infection with antibiotic.  Urine culture and vaginal swab are pending.  We will call if they are abnormal and treat as appropriate.  Please refrain from sexual activity until test results and treatment are complete.  Your blood work for pregnancy is also pending as your urine pregnancy test was negative.  Please follow-up if symptoms persist or worsen.    ED Prescriptions     Medication Sig Dispense Auth. Provider   cephALEXin (KEFLEX) 500 MG capsule Take 1 capsule (500 mg total) by mouth 4 (four) times daily. 28 capsule [place], Acie Fredrickson, [place]      PDMP not reviewed this encounter.   Gustavus Bryant, [place] 12/14/21 1205    Gustavus Bryant, [place] 12/14/21 1205

## 2021-12-14 NOTE — Discharge Instructions (Signed)
You are being treated for urinary tract infection with antibiotic.  Urine culture and vaginal swab are pending.  We will call if they are abnormal and treat as appropriate.  Please refrain from sexual activity until test results and treatment are complete.  Your blood work for pregnancy is also pending as your urine pregnancy test was negative.  Please follow-up if symptoms persist or worsen.

## 2021-12-15 ENCOUNTER — Telehealth (HOSPITAL_COMMUNITY): Payer: Self-pay | Admitting: Emergency Medicine

## 2021-12-15 LAB — CERVICOVAGINAL ANCILLARY ONLY
Bacterial Vaginitis (gardnerella): POSITIVE — AB
Candida Glabrata: NEGATIVE
Candida Vaginitis: POSITIVE — AB
Chlamydia: NEGATIVE
Comment: NEGATIVE
Comment: NEGATIVE
Comment: NEGATIVE
Comment: NEGATIVE
Comment: NEGATIVE
Comment: NORMAL
Neisseria Gonorrhea: NEGATIVE
Trichomonas: NEGATIVE

## 2021-12-15 LAB — URINE CULTURE: Culture: 2000 — AB

## 2021-12-15 MED ORDER — METRONIDAZOLE 500 MG PO TABS: 500.0000 mg | ORAL_TABLET | Freq: Two times a day (BID) | ORAL | 0 refills | Status: DC

## 2021-12-15 MED ORDER — FLUCONAZOLE 150 MG PO TABS: 150.0000 mg | ORAL_TABLET | Freq: Once | ORAL | 0 refills | Status: AC

## 2021-12-29 ENCOUNTER — Emergency Department (HOSPITAL_COMMUNITY)
Admission: EM | Admit: 2021-12-29 | Discharge: 2021-12-29 | Disposition: A | Discharge status: Home / Self Care | Payer: Medicaid Other | Attending: Emergency Medicine | Admitting: Emergency Medicine

## 2021-12-29 ENCOUNTER — Other Ambulatory Visit: Payer: Self-pay

## 2021-12-29 ENCOUNTER — Encounter (HOSPITAL_COMMUNITY): Payer: Self-pay

## 2021-12-29 DIAGNOSIS — R509 Fever, unspecified: Secondary | ICD-10-CM | POA: Diagnosis present

## 2021-12-29 DIAGNOSIS — U071 COVID-19: Secondary | ICD-10-CM | POA: Insufficient documentation

## 2021-12-29 LAB — GROUP A STREP BY PCR: Group A Strep by PCR: NOT DETECTED

## 2021-12-29 LAB — SARS CORONAVIRUS 2 BY RT PCR: SARS Coronavirus 2 by RT PCR: POSITIVE — AB

## 2021-12-29 MED ORDER — ACETAMINOPHEN 325 MG PO TABS
650.0000 mg | ORAL_TABLET | Freq: Once | ORAL | Status: AC | PRN
Start: 2021-12-29 — End: 2021-12-29
  Administered 2021-12-29: 650 mg via ORAL
  Filled 2021-12-29: qty 2

## 2021-12-29 NOTE — ED Provider Notes (Signed)
[facility]-EMERGENCY DEPT Provider Note   CSN: [id number] Arrival date & time: 12/29/21  1311     History  Chief Complaint  Patient presents with   Sore Throat   Chills   Cough   Generalized Body Aches   Fever    Judith [name] is a 21 y.o. female.  Patient presents to the emergency department for evaluation of 2 days of runny nose, cough, body aches, fever and sore throat.  She denies chest pain or shortness of breath.  No nausea, vomiting, or diarrhea.  No known sick contacts.  No history of asthma or other respiratory issues.  No urinary symptoms or skin rash.       Home Medications Prior to Admission medications   Medication Sig Start Date End Date Taking? Authorizing Provider  cephALEXin (KEFLEX) 500 MG capsule Take 1 capsule (500 mg total) by mouth 4 (four) times daily. 12/14/21   Gustavus Bryant, FNP  metroNIDAZOLE (FLAGYL) 500 MG tablet Take 1 tablet (500 mg total) by mouth 2 (two) times daily. 12/15/21   Lamptey, Britta Mccreedy, MD  ondansetron (ZOFRAN-ODT) 4 MG disintegrating tablet Take 1 tablet (4 mg total) by mouth every 8 (eight) hours as needed for nausea or vomiting. 12/05/21   Zenia Resides, MD      Allergies    Patient has no known allergies.    Review of Systems   Review of Systems  Physical Exam Updated Vital Signs BP 102/69 (BP Location: Right Arm)   Pulse (!) 113   Temp (!) 100.9 F (38.3 C) (Oral)   Resp 18   Ht 4\' 11"  (1.499 m)   Wt 59.4 kg   LMP 12/22/2021 (Within Days)   SpO2 94%   BMI 26.46 kg/m  Physical Exam Vitals and nursing note reviewed.  Constitutional:      Appearance: She is well-developed.  HENT:     Head: Normocephalic and atraumatic.     Jaw: No trismus.     Right Ear: Tympanic membrane, ear canal and external ear normal. Tympanic membrane is not erythematous.     Left Ear: Tympanic membrane, ear canal and external ear normal. Tympanic membrane is not erythematous.     Nose: Congestion present. No  mucosal edema or rhinorrhea.     Mouth/Throat:     Mouth: Mucous membranes are moist. Mucous membranes are not dry. No oral lesions.     Pharynx: Uvula midline. Posterior oropharyngeal erythema present. No oropharyngeal exudate or uvula swelling.     Tonsils: No tonsillar abscesses.  Eyes:     General:        Right eye: No discharge.        Left eye: No discharge.     Conjunctiva/sclera: Conjunctivae normal.  Cardiovascular:     Rate and Rhythm: Regular rhythm. Tachycardia present.     Heart sounds: Normal heart sounds.  Pulmonary:     Effort: Pulmonary effort is normal. No respiratory distress.     Breath sounds: Normal breath sounds. No wheezing or rales.  Abdominal:     Palpations: Abdomen is soft.     Tenderness: There is no abdominal tenderness.  Musculoskeletal:     Cervical back: Normal range of motion and neck supple.  Lymphadenopathy:     Cervical: No cervical adenopathy.  Skin:    General: Skin is warm and dry.  Neurological:     Mental Status: She is alert.  Psychiatric:        Mood and  Affect: Mood normal.     ED Results / Procedures / Treatments   Labs (all labs ordered are listed, but only abnormal results are displayed) Labs Reviewed  GROUP A STREP BY PCR  SARS CORONAVIRUS 2 BY RT PCR    EKG None  Radiology No results found.  Procedures Procedures    Medications Ordered in ED Medications  acetaminophen (TYLENOL) tablet 650 mg (650 mg Oral Given 12/29/21 1607)    ED Course/ Medical Decision Making/ A&P    Patient seen and examined. History obtained directly from patient.   Labs/EKG: Ordered COVID testing and strep testing  Imaging: None ordered, patient in no respiratory distress, labs reassuring.  Medications/Fluids: Tylenol ordered by nursing protocol  Most recent vital signs reviewed and are as follows: BP 102/69 (BP Location: Right Arm)   Pulse (!) 113   Temp (!) 100.9 F (38.3 C) (Oral)   Resp 18   Ht 4\' 11"  (1.499 m)   Wt  59.4 kg   LMP 12/22/2021 (Within Days)   SpO2 94%   BMI 26.46 kg/m   Initial impression: Upper respiratory infection with fever, patient nontoxic in appearance, low concern for severe sepsis.  5:45 PM Reassessment performed. Patient appears stable.  Labs personally reviewed and interpreted including: Strep negative, COVID pending  Reviewed pertinent lab work and imaging with patient at bedside. Questions answered.   Plan: Discharge to home.   Prescriptions written for: None  Other home care instructions discussed: Detailed discussion had with with patient regarding COVID-19 precautions, if test is positive, and written instructions given as well.  We discussed need to isolate themselves for 5 days from onset of symptoms and have 24 hours of improvement prior to breaking isolation.  We discussed that when breaking isolation, mask wearing for 5 additional days is required.  We discussed signs symptoms to return which include worsening shortness of breath, trouble breathing, or increased work of breathing.  Also return with persistent vomiting, confusion, passing out, or if they have any other concerns. Counseled on the need for rest and good hydration. Discussed that high-risk contacts should be aware of positive result and they need to quarantine and be tested if they develop any symptoms. Patient verbalizes understanding.   Judith [name] was evaluated in Emergency Department on 12/29/2021 for the symptoms described in the history of present illness. She was evaluated in the context of the global COVID-19 pandemic, which necessitated consideration that the patient might be at risk for infection with the SARS-CoV-2 virus that causes COVID-19. Institutional protocols and algorithms that pertain to the evaluation of patients at risk for COVID-19 are in a state of rapid change based on information released by regulatory bodies including the CDC and federal and state organizations. These policies and  algorithms were followed during the patient's care in the ED.                            Medical Decision Making Risk OTC drugs.   Patient with febrile illness.  Well-appearing, low concern for severe sepsis.  Strep test negative.  COVID pending.  She appears appropriate for symptomatic treatment at home.  No significant comorbidities.  The patient's vital signs, pertinent lab work and imaging were reviewed and interpreted as discussed in the ED course. Hospitalization was considered for further testing, treatments, or serial exams/observation. However as patient is well-appearing, has a stable exam, and reassuring studies today, I do not feel that they warrant  admission at this time. This plan was discussed with the patient who verbalizes agreement and comfort with this plan and seems reliable and able to return to the Emergency Department with worsening or changing symptoms.          Final Clinical Impression(s) / ED Diagnoses Final diagnoses:  Febrile illness    Rx / DC Orders ED Discharge Orders     None         Carlisle Cater, Hershal Coria 12/29/21 1746    Valarie Merino, MD 12/29/21 [phone]

## 2021-12-29 NOTE — ED Triage Notes (Addendum)
Patient reports chills, sore throat, body aches, sore throat, and cough x 2 days. Fever in triage 100.9.

## 2021-12-29 NOTE — Discharge Instructions (Signed)
Please read and follow all provided instructions.  Your diagnoses today include:  1. Febrile illness     Tests performed today include: Vital signs. See below for your results today.  COVID test - pending, check mychart for results Strep test - was negative  Medications prescribed:  None  Take any prescribed medications only as directed. Treatment for your infection is aimed at treating the symptoms. There are no medications, such as antibiotics, that will cure your infection.   Home care instructions:  Follow any educational materials contained in this packet.   Your illness is contagious and can be spread to others, especially during the first 3 or 4 days. It cannot be cured by antibiotics or other medicines. Take basic precautions such as washing your hands often, covering your mouth when you cough or sneeze, and avoiding public places where you could spread your illness to others.   Please continue drinking plenty of fluids.  Use over-the-counter medicines as needed as directed on packaging for symptom relief.  You may also use ibuprofen or tylenol as directed on packaging for pain or fever.  Do not take multiple medicines containing Tylenol or acetaminophen to avoid taking too much of this medication.  If you are positive for Covid-19, you should isolate yourself and not be exposed to other people for 5 days after your symptoms began. If you are not feeling better at day 5, you need to isolate yourself for a total of 10 days. If you are feeling better by day 5, you should wear a mask properly, over your nose and mouth, at all times while around other people until 10 days after your symptoms started.   Follow-up instructions: Please follow-up with your primary care provider as needed for further evaluation of your symptoms if you are not feeling better.   Return instructions:  Please return to the Emergency Department if you experience worsening symptoms.  Return to the emergency  department if you have worsening shortness of breath breathing or increased work of breathing, persistent vomiting RETURN IMMEDIATELY IF you develop shortness of breath, confusion or altered mental status, a new rash, become dizzy, faint, or poorly responsive, or are unable to be cared for at home. Please return if you have persistent vomiting and cannot keep down fluids or develop a fever that is not controlled by tylenol or motrin.   Please return if you have any other emergent concerns.  Additional Information:  Your vital signs today were: BP 102/69 (BP Location: Right Arm)   Pulse (!) 113   Temp (!) 100.9 F (38.3 C) (Oral)   Resp 18   Ht 4\' 11"  (1.499 m)   Wt 59.4 kg   LMP 12/22/2021 (Within Days)   SpO2 94%   BMI 26.46 kg/m  If your blood pressure (BP) was elevated above 135/85 this visit, please have this repeated by your doctor within one month. --------------

## 2022-01-27 ENCOUNTER — Ambulatory Visit (INDEPENDENT_AMBULATORY_CARE_PROVIDER_SITE_OTHER): Payer: Medicaid Other | Admitting: Plastic Surgery

## 2022-01-27 ENCOUNTER — Encounter: Payer: Self-pay | Admitting: Plastic Surgery

## 2022-01-27 DIAGNOSIS — G8929 Other chronic pain: Secondary | ICD-10-CM

## 2022-01-27 DIAGNOSIS — M545 Low back pain, unspecified: Secondary | ICD-10-CM

## 2022-01-27 DIAGNOSIS — M542 Cervicalgia: Secondary | ICD-10-CM | POA: Diagnosis not present

## 2022-01-27 DIAGNOSIS — N62 Hypertrophy of breast: Secondary | ICD-10-CM | POA: Diagnosis not present

## 2022-01-27 DIAGNOSIS — M546 Pain in thoracic spine: Secondary | ICD-10-CM | POA: Diagnosis not present

## 2022-01-27 DIAGNOSIS — M549 Dorsalgia, unspecified: Secondary | ICD-10-CM | POA: Insufficient documentation

## 2022-01-27 DIAGNOSIS — Z803 Family history of malignant neoplasm of breast: Secondary | ICD-10-CM

## 2022-01-27 DIAGNOSIS — Z6825 Body mass index (BMI) 25.0-25.9, adult: Secondary | ICD-10-CM

## 2022-01-27 NOTE — Progress Notes (Signed)
Patient ID: Judith [name], female    DOB: December 02, 2000, 21 y.o.   MRN: [id number]   Chief Complaint  Patient presents with   Consult   Breast Problem    Mammary Hyperplasia: The patient is a 21 y.o. female with a history of mammary hyperplasia for several years.  She has extremely large breasts causing symptoms that include the following: Back pain in the upper and lower back, including neck pain. She pulls or pins her bra straps to provide better lift and relief of the pressure and pain. She notices relief by holding her breast up manually.  Her shoulder straps cause grooves and pain and pressure that requires padding for relief. Pain medication is sometimes required with motrin and tylenol.  Activities that are hindered by enlarged breasts include: exercise and running.  She has tried supportive clothing as well as fitted bras without improvement.  Her breasts are extremely large and fairly symmetric.  She has hyperpigmentation of the inframammary area on both sides.  The sternal to nipple distance on the right is 27 cm and the left is 28 cm.  The IMF distance is 16 cm.  She is 4 feet 11 inches tall and weighs 128 pounds.  The BMI = 25.9 kg/m.  Preoperative bra size = not sure by likely D-DD cup. She would like to be a C cup.  The estimated excess breast tissue to be removed at the time of surgery = 250 grams on the left and 250 grams on the right.  Mammogram history: none.  Family history of breast cancer:  maternal great great grandmother.  Tobacco use:  none.   The patient expresses the desire to pursue surgical intervention.     Review of Systems  Constitutional:  Positive for activity change. Negative for appetite change.  Eyes: Negative.   Respiratory: Negative.  Negative for chest tightness.   Cardiovascular: Negative.  Negative for leg swelling.  Gastrointestinal: Negative.   Endocrine: Negative.   Musculoskeletal:  Positive for back pain and neck pain.  Skin: Negative.    Hematological: Negative.   Psychiatric/Behavioral: Negative.      Past Medical History:  Diagnosis Date   Hidradenitis suppurativa    Irregular periods     History reviewed. No pertinent surgical history.   No current outpatient medications on file.   Objective:   Vitals:   01/27/22 0915  BP: 116/76  Pulse: 89  SpO2: 98%    Physical Exam Vitals and nursing note reviewed.  Constitutional:      Appearance: Normal appearance.  HENT:     Head: Normocephalic and atraumatic.  Cardiovascular:     Rate and Rhythm: Normal rate.     Pulses: Normal pulses.  Pulmonary:     Effort: Pulmonary effort is normal.  Abdominal:     General: There is no distension.     Palpations: Abdomen is soft.     Tenderness: There is no abdominal tenderness.  Musculoskeletal:        General: No swelling or deformity.  Skin:    General: Skin is warm.     Capillary Refill: Capillary refill takes less than 2 seconds.     Coloration: Skin is not jaundiced.     Findings: No bruising.  Neurological:     Mental Status: She is alert and oriented to person, place, and time.  Psychiatric:        Mood and Affect: Mood normal.        Behavior: Behavior  normal.        Thought Content: Thought content normal.        Judgment: Judgment normal.     Assessment & Plan:  Symptomatic mammary hypertrophy  Chronic bilateral thoracic back pain  The procedure the patient selected and that was best for the patient was discussed. The risk were discussed and include but not limited to the following:  Breast asymmetry, fluid accumulation, firmness of the breast, inability to breast feed, loss of nipple or areola, skin loss, change in skin and nipple sensation, fat necrosis of the breast tissue, bleeding, infection and healing delay.  There are risks of anesthesia and injury to nerves or blood vessels.  Allergic reaction to tape, suture and skin glue are possible.  There will be swelling.  Any of these can lead to  the need for revisional surgery.  A breast reduction has potential to interfere with diagnostic procedures in the future.  This procedure is best done when the breast is fully developed.  Changes in the breast will continue to occur over time: pregnancy, weight gain or weigh loss.    Total time: 40 minutes. This includes time spent with the patient during the visit as well as time spent before and after the visit reviewing the chart, documenting the encounter, ordering pertinent studies and literature for the patient.    The patient is a good candidate for bilateral breast reduction.  Will submit for preauthorization.  Pictures were obtained of the patient and placed in the chart with the patient's or guardian's permission.   [place], DO

## 2022-02-17 ENCOUNTER — Telehealth: Payer: Self-pay | Admitting: Plastic Surgery

## 2022-02-17 NOTE — Telephone Encounter (Signed)
Please call pt with update on insurance for coverage for procedure with Dr. Marla Roe

## 2022-02-24 ENCOUNTER — Telehealth: Payer: Self-pay | Admitting: *Deleted

## 2022-02-24 NOTE — Telephone Encounter (Signed)
Auth submitted to [employer] via portal for cpt 19318 2 units  Case: [id number]

## 2022-03-11 ENCOUNTER — Telehealth: Payer: Self-pay | Admitting: *Deleted

## 2022-03-11 NOTE — Telephone Encounter (Signed)
Notified patient of approval but that OR time is booked through 2023. Will call if anything cancels

## 2022-03-17 ENCOUNTER — Ambulatory Visit
Admission: EM | Admit: 2022-03-17 | Discharge: 2022-03-17 | Disposition: A | Discharge status: Home / Self Care | Payer: Medicaid Other | Attending: Emergency Medicine | Admitting: Emergency Medicine

## 2022-03-17 DIAGNOSIS — Z789 Other specified health status: Secondary | ICD-10-CM | POA: Insufficient documentation

## 2022-03-17 DIAGNOSIS — Z113 Encounter for screening for infections with a predominantly sexual mode of transmission: Secondary | ICD-10-CM | POA: Insufficient documentation

## 2022-03-17 DIAGNOSIS — K0501 Acute gingivitis, non-plaque induced: Secondary | ICD-10-CM | POA: Diagnosis present

## 2022-03-17 LAB — POCT URINE PREGNANCY: Preg Test, Ur: NEGATIVE

## 2022-03-17 MED ORDER — PENICILLIN V POTASSIUM 500 MG PO TABS: 500.0000 mg | ORAL_TABLET | Freq: Three times a day (TID) | ORAL | 0 refills | Status: AC

## 2022-03-17 NOTE — ED Provider Notes (Signed)
[facility]    CSN: [id number] Arrival date & time: 03/17/22  0350    HISTORY   Chief Complaint  Patient presents with   Oral Swelling   HPI Judith [name] is a pleasant, 21 y.o. female who presents to urgent care today. Patient reports waking up with swelling in her upper lip yesterday.  Patient states she has a piercing inside of her upper lip and engaged in oral sex 2 days prior to onset of swelling.  Patient is also requesting STD testing.  Patient endorses vaginal itching.  Patient denies burning with urination, increased frequency of urination, suprapubic pain, perineal pain, flank pain, fever, chills, malaise, rigors, significant fatigue, abnormal vaginal discharge, vaginal irritation, dyspareunia, genital lesion(s), and known exposure to STD.     The history is provided by the patient.   Past Medical History:  Diagnosis Date   Hidradenitis suppurativa    Irregular periods    Patient Active Problem List   Diagnosis Date Noted   Symptomatic mammary hypertrophy 01/27/2022   Back pain 01/27/2022   History reviewed. No pertinent surgical history. OB History     Gravida  1   Para      Term      Preterm      AB      Living         SAB      IAB      Ectopic      Multiple      Live Births             Home Medications    Prior to Admission medications   Not on File    Family History Family History  Problem Relation Age of Onset   Asthma Mother    Diabetes Mother    Kidney failure Father    Social History Social History   Tobacco Use   Smoking status: Never   Smokeless tobacco: Never  Vaping Use   Vaping Use: Former   Substances: Flavoring  Substance Use Topics   Alcohol use: No   Drug use: No   Allergies   Patient has no known allergies.  Review of Systems Review of Systems Pertinent findings revealed after performing a 14 point review of systems has been noted in the history of present illness.  Physical  Exam Triage Vital Signs ED Triage Vitals  Enc Vitals Group     BP 02/28/21 0827 (!) 147/82     Pulse Rate 02/28/21 0827 72     Resp 02/28/21 0827 18     Temp 02/28/21 0827 98.3 F (36.8 C)     Temp Source 02/28/21 0827 Oral     SpO2 02/28/21 0827 98 %     Weight --      Height --      Head Circumference --      Peak Flow --      Pain Score 02/28/21 0826 5     Pain Loc --      Pain Edu? --      Excl. in GC? --   No data found.  Updated Vital Signs BP 108/70 (BP Location: Right Arm)   Pulse 81   Temp 99 F (37.2 C) (Oral)   Resp 16   LMP 03/07/2022   SpO2 99%   Physical Exam Vitals and nursing note reviewed.  Constitutional:      General: She is not in acute distress.    Appearance: Normal appearance. She is not  ill-appearing.  HENT:     Head: Normocephalic and atraumatic.     Mouth/Throat:     Comments: Erythema and swelling appreciated at the piercing of frenulum beneath upper lip and gingiva without drainage. Eyes:     General: Lids are normal.        Right eye: No discharge.        Left eye: No discharge.     Extraocular Movements: Extraocular movements intact.     Conjunctiva/sclera: Conjunctivae normal.     Right eye: Right conjunctiva is not injected.     Left eye: Left conjunctiva is not injected.  Neck:     Trachea: Trachea and phonation normal.  Cardiovascular:     Rate and Rhythm: Normal rate and regular rhythm.     Pulses: Normal pulses.     Heart sounds: Normal heart sounds. No murmur heard.    No friction rub. No gallop.  Pulmonary:     Effort: Pulmonary effort is normal. No accessory muscle usage, prolonged expiration or respiratory distress.     Breath sounds: Normal breath sounds. No stridor, decreased air movement or transmitted upper airway sounds. No decreased breath sounds, wheezing, rhonchi or rales.  Chest:     Chest wall: No tenderness.  Genitourinary:    Comments: Patient politely declines pelvic exam today, patient provided a vaginal  swab for testing. Musculoskeletal:        General: Normal range of motion.     Cervical back: Normal range of motion and neck supple. Normal range of motion.  Lymphadenopathy:     Cervical: No cervical adenopathy.  Skin:    General: Skin is warm and dry.     Findings: No erythema or rash.  Neurological:     General: No focal deficit present.     Mental Status: She is alert and oriented to person, place, and time.  Psychiatric:        Mood and Affect: Mood normal.        Behavior: Behavior normal.     Visual Acuity Right Eye Distance:   Left Eye Distance:   Bilateral Distance:    Right Eye Near:   Left Eye Near:    Bilateral Near:     UC Couse / Diagnostics / Procedures:     Radiology No results found.  Procedures Procedures (including critical care time) EKG  Pending results:  Labs Reviewed  RPR  HIV ANTIBODY (ROUTINE TESTING W REFLEX)  POCT URINE PREGNANCY  CERVICOVAGINAL ANCILLARY ONLY    Medications Ordered in UC: Medications - No data to display  UC Diagnoses / Final Clinical Impressions(s)   I have reviewed the triage vital signs and the nursing notes.  Pertinent labs & imaging results that were available during my care of the patient were reviewed by me and considered in my medical decision making (see chart for details).    Final diagnoses:  Screening examination for venereal disease  Acute gingivitis, non-plaque induced  Multiple body piercings    {LMSTDP:27058}  {LMUTIP:27060}  ED Prescriptions     Medication Sig Dispense Auth. Provider   penicillin v potassium (VEETID) 500 MG tablet Take 1 tablet (500 mg total) by mouth 3 (three) times daily for 7 days. 21 tablet Theadora Rama Scales, PA-C      PDMP not reviewed this encounter.  Disposition Upon Discharge:  Condition: stable for discharge home  Patient presented with concern for an acute illness with associated systemic symptoms and significant discomfort requiring urgent  management.  In my opinion, this is a condition that a prudent lay person (someone who possesses an average knowledge of health and medicine) may potentially expect to result in complications if not addressed urgently such as respiratory distress, impairment of bodily function or dysfunction of bodily organs.   As such, the patient has been evaluated and assessed, work-up was performed and treatment was provided in alignment with urgent care protocols and evidence based medicine.  Patient/parent/caregiver has been advised that the patient may require follow up for further testing and/or treatment if the symptoms continue in spite of treatment, as clinically indicated and appropriate.  Routine symptom specific, illness specific and/or disease specific instructions were discussed with the patient and/or caregiver at length.  Prevention strategies for avoiding STD exposure were also discussed.  The patient will follow up with their current PCP if and as advised. If the patient does not currently have a PCP we will assist them in obtaining one.   The patient may need specialty follow up if the symptoms continue, in spite of conservative treatment and management, for further workup, evaluation, consultation and treatment as clinically indicated and appropriate.  Patient/parent/caregiver verbalized understanding and agreement of plan as discussed.  All questions were addressed during visit.  Please see discharge instructions below for further details of plan.  Discharge Instructions:   Discharge Instructions      Please remove the stud from your infected piercing.  Please begin penicillin 500 mg 3 times daily for the next 7 days.  Please follow-up with either your primary care provider or return to urgent care if you do not have relief of the swelling and infection in your lip.  The results of your vaginal swab test which screens for BV, yeast, gonorrhea, chlamydia and trichomonas will be made posted to  your MyChart account once it is complete.  This typically takes 2 to 4 days.  Please abstain from sexual intercourse of any kind, vaginal, oral or anal, until you have received the results of your STD testing.     The results of your HIV and syphilis blood tests will be made available to you once they are complete.  They will initially be posted to your MyChart account which typically takes 2 to 3 days.     If any of your results are abnormal, you will receive a phone call regarding treatment.  Prescriptions, if any are needed, will be provided for you at your pharmacy.    Your urine pregnancy test today is negative.  Please remember that the elevated prevent pregnancy is to use birth control when having sexual intercourse.   Thank you for visiting urgent care today.       This office note has been dictated using Teaching laboratory technician.  Unfortunately, this method of dictation can sometimes lead to typographical or grammatical errors.  I apologize for your inconvenience in advance if this occurs.  Please do not hesitate to reach out to me if clarification is needed.

## 2022-03-17 NOTE — Discharge Instructions (Signed)
Please remove the stud from your infected piercing.  Please begin penicillin 500 mg 3 times daily for the next 7 days.  Please follow-up with either your primary care provider or return to urgent care if you do not have relief of the swelling and infection in your lip.  The results of your vaginal swab test which screens for BV, yeast, gonorrhea, chlamydia and trichomonas will be made posted to your MyChart account once it is complete.  This typically takes 2 to 4 days.  Please abstain from sexual intercourse of any kind, vaginal, oral or anal, until you have received the results of your STD testing.     The results of your HIV and syphilis blood tests will be made available to you once they are complete.  They will initially be posted to your MyChart account which typically takes 2 to 3 days.     If any of your results are abnormal, you will receive a phone call regarding treatment.  Prescriptions, if any are needed, will be provided for you at your pharmacy.    Your urine pregnancy test today is negative.  Please remember that the elevated prevent pregnancy is to use birth control when having sexual intercourse.   Thank you for visiting urgent care today.

## 2022-03-17 NOTE — ED Triage Notes (Signed)
Pt states she woke yesterday am with swelling to upper lip-denies injury-reports she does have a piercing inside upper lip and engaged in oral sex 2 days prior to c/o-pt also requesting STD screening-denies vaginal d/c however states she is having vaginal itching-NAD-steady gait

## 2022-03-18 LAB — CERVICOVAGINAL ANCILLARY ONLY
Bacterial Vaginitis (gardnerella): POSITIVE — AB
Candida Glabrata: NEGATIVE
Candida Vaginitis: POSITIVE — AB
Chlamydia: NEGATIVE
Comment: NEGATIVE
Comment: NEGATIVE
Comment: NEGATIVE
Comment: NEGATIVE
Comment: NEGATIVE
Comment: NORMAL
Neisseria Gonorrhea: NEGATIVE
Trichomonas: NEGATIVE

## 2022-03-18 LAB — RPR: RPR Ser Ql: NONREACTIVE

## 2022-03-18 LAB — HIV ANTIBODY (ROUTINE TESTING W REFLEX): HIV Screen 4th Generation wRfx: NONREACTIVE

## 2022-03-19 ENCOUNTER — Telehealth (HOSPITAL_COMMUNITY): Payer: Self-pay | Admitting: Emergency Medicine

## 2022-03-19 MED ORDER — METRONIDAZOLE 500 MG PO TABS
500.0000 mg | ORAL_TABLET | Freq: Two times a day (BID) | ORAL | 0 refills | Status: DC
Start: 2022-03-19 — End: 2022-06-04

## 2022-03-19 MED ORDER — FLUCONAZOLE 150 MG PO TABS: 150.0000 mg | ORAL_TABLET | Freq: Once | ORAL | 0 refills | Status: AC

## 2022-05-09 ENCOUNTER — Encounter: Payer: Self-pay | Admitting: Plastic Surgery

## 2022-05-14 ENCOUNTER — Telehealth: Payer: Self-pay

## 2022-05-14 NOTE — Telephone Encounter (Signed)
Called to extend date of Timnath [id number], due to past 04/25/22, a new submission will need to be sent in for CPT Code#19318  [phone]

## 2022-05-19 ENCOUNTER — Telehealth: Payer: Self-pay | Admitting: *Deleted

## 2022-05-19 NOTE — Telephone Encounter (Signed)
New auth submitted - pending [id number]

## 2022-06-04 ENCOUNTER — Encounter: Payer: Self-pay | Admitting: Student

## 2022-06-04 ENCOUNTER — Ambulatory Visit (INDEPENDENT_AMBULATORY_CARE_PROVIDER_SITE_OTHER): Payer: Medicaid Other | Admitting: Student

## 2022-06-04 VITALS — BP 114/75 | HR 85 | Ht 59.0 in | Wt 128.8 lb

## 2022-06-04 DIAGNOSIS — N62 Hypertrophy of breast: Secondary | ICD-10-CM

## 2022-06-04 DIAGNOSIS — Z9289 Personal history of other medical treatment: Secondary | ICD-10-CM

## 2022-06-04 HISTORY — DX: Personal history of other medical treatment: Z92.89

## 2022-06-04 MED ORDER — OXYCODONE HCL 5 MG PO TABS: 5.0000 mg | ORAL_TABLET | Freq: Three times a day (TID) | ORAL | 0 refills | Status: DC | PRN

## 2022-06-04 MED ORDER — OXYCODONE HCL 5 MG PO TABS: 5.0000 mg | ORAL_TABLET | Freq: Four times a day (QID) | ORAL | 0 refills | Status: DC | PRN

## 2022-06-04 MED ORDER — CEPHALEXIN 500 MG PO CAPS: 500.0000 mg | ORAL_CAPSULE | Freq: Four times a day (QID) | ORAL | 0 refills | Status: AC

## 2022-06-04 MED ORDER — ONDANSETRON HCL 4 MG PO TABS: 4.0000 mg | ORAL_TABLET | Freq: Three times a day (TID) | ORAL | 0 refills | Status: DC | PRN

## 2022-06-04 NOTE — Progress Notes (Signed)
Patient ID: Judith [name], female    DOB: 08-Jan-2001, 22 y.o.   MRN: [id number]  No chief complaint on file.     ICD-10-CM   1. Symptomatic mammary hypertrophy  N62        History of Present Illness: Judith [name] is a 22 y.o.  female  with a history of macromastia.  She presents for preoperative evaluation for upcoming procedure, Bilateral Breast Reduction with liposuction, scheduled for 06/17/2022 with Dr.  Marla Roe  The patient has never had anesthesia before.  She denies any personal or family history of breast cancer.  She states she has never had a mammogram or need for mammogram.  Patient denies any cardiac diseases.  She states that she is not a smoker.  Patient denies taking any birth control or hormone replacement.  She denies any history of miscarriages.  She denies any personal or family history of blood clots or clotting diseases.  She denies any recent traumas, surgeries, infections or hospitalizations.  She denies any history of stroke or heart attack.  She denies any history of Crohn's disease or ulcerative colitis.  She denies any history of asthma.  She denies any history of cancer.  She denies any varicose veins or swollen legs.  She denies any recent fevers, chills or changes in her health.  Patient states that she is unsure of her current exact bra size, but she believes she is probably at the or a DD cup.  Patient states that she would like to be around a C cup.  I discussed with the patient the limitations of surgery and how much tissue can be removed at the time of surgery and that cup size cannot be guaranteed.  Patient expressed understanding.  Summary of Previous Visit: Patient was seen by Dr. Marla Roe on 01/27/2022.  At this visit, she complained of back pain and neck pain due to her enlarged breasts.  She also reported that her shoulder straps were causing grooving.  On exam, the STN on the right was 27 centimeters and 28 cm on the left.  Patient's BMI was  25.9 kg/m.  Patient was unsure of her preoperative bra size, but was likely a D-DD cup.  Patient reported that she would like to be a C cup.  Patient was found to be a good candidate for bilateral breast reduction.  Estimated excess breast tissue to be removed at time of surgery: 250 grams bilaterally  Job: Works as a Scientist, water quality at [employer], planning to take 1 week off.  PMH Significant for: Symptomatic macromastia, denies any chronic diseases or conditions, denies taking any medications   Past Medical History: Allergies: No Known Allergies  Current Medications:  Current Outpatient Medications:    metroNIDAZOLE (FLAGYL) 500 MG tablet, Take 1 tablet (500 mg total) by mouth 2 (two) times daily., Disp: 14 tablet, Rfl: 0  Past Medical Problems: Past Medical History:  Diagnosis Date   Hidradenitis suppurativa    Irregular periods     Past Surgical History: No past surgical history on file.  Social History: Social History   Socioeconomic History   Marital status: Single    Spouse name: Not on file   Number of children: Not on file   Years of education: Not on file   Highest education level: Not on file  Occupational History   Not on file  Tobacco Use   Smoking status: Never   Smokeless tobacco: Never  Vaping Use   Vaping Use: Former   Substances:  Flavoring  Substance and Sexual Activity   Alcohol use: No   Drug use: No   Sexual activity: Yes    Partners: Male    Birth control/protection: None  Other Topics Concern   Not on file  Social History Narrative   Not on file   Social Determinants of Health   Financial Resource Strain: Not on file  Food Insecurity: Not on file  Transportation Needs: Not on file  Physical Activity: Not on file  Stress: Not on file  Social Connections: Not on file  Intimate Partner Violence: Not on file    Family History: Family History  Problem Relation Age of Onset   Asthma Mother    Diabetes Mother    Kidney failure Father      Review of Systems: Denies any fevers, chills or recent changes in her health  Physical Exam: Vital Signs There were no vitals taken for this visit.  Physical Exam  Constitutional:      General: Not in acute distress.    Appearance: Normal appearance. Not ill-appearing.  HENT:     Head: Normocephalic and atraumatic.  Neck:     Musculoskeletal: Normal range of motion.  Cardiovascular:     Rate and Rhythm: Normal rate Pulmonary:     Effort: Pulmonary effort is normal. No respiratory distress.  Musculoskeletal: Normal range of motion.  Skin:    General: Skin is warm and dry.     Findings: No erythema or rash.  Neurological:     Mental Status: Alert and oriented to person, place, and time. Mental status is at baseline.  Psychiatric:        Mood and Affect: Mood normal.        Behavior: Behavior normal.    Assessment/Plan: The patient is scheduled for bilateral breast reduction with Dr. Marla Roe.  Risks, benefits, and alternatives of procedure discussed, questions answered and consent obtained.    Smoking Status: Non-smoker; Counseling Given?  N/A Last Mammogram: N/A; Results: N/A  Caprini Score: 3; Risk Factors include: BMI > 25, and length of planned surgery. Recommendation for mechanical prophylaxis. Encourage early ambulation.   Pictures obtained: @consult$   Post-op Rx sent to pharmacy: Oxycodone, Zofran, Keflex  I discussed with the patient to hold ibuprofen, multivitamins and dietary supplements at least 1 week prior to surgery.  Patient expressed understanding.  Patient was provided with the breast reduction and General Surgical Risk consent document and Pain Medication Agreement prior to their appointment.  They had adequate time to read through the risk consent documents and Pain Medication Agreement. We also discussed them in person together during this preop appointment. All of their questions were answered to their satisfaction.  Recommended calling if they  have any further questions.  Risk consent form and Pain Medication Agreement to be scanned into patient's chart.  The risk that can be encountered with breast reduction were discussed and include the following but not limited to these:  Breast asymmetry, fluid accumulation, firmness of the breast, inability to breast feed, loss of nipple or areola, skin loss, decrease or no nipple sensation, fat necrosis of the breast tissue, bleeding, infection, healing delay.  There are risks of anesthesia, changes to skin sensation and injury to nerves or blood vessels.  The muscle can be temporarily or permanently injured.  You may have an allergic reaction to tape, suture, glue, blood products which can result in skin discoloration, swelling, pain, skin lesions, poor healing.  Any of these can lead to the need for  revisonal surgery or stage procedures.  A reduction has potential to interfere with diagnostic procedures.  Nipple or breast piercing can increase risks of infection.  This procedure is best done when the breast is fully developed.  Changes in the breast will continue to occur over time.  Pregnancy can alter the outcomes of previous breast reduction surgery, weight gain and weigh loss can also effect the long term appearance.     Electronically signed by: Clance Boll, PA-C 06/04/2022 7:56 AM

## 2022-06-04 NOTE — H&P (View-Only) (Signed)
Patient ID: Judith [name], female    DOB: 08-Jan-2001, 22 y.o.   MRN: [id number]  No chief complaint on file.     ICD-10-CM   1. Symptomatic mammary hypertrophy  N62        History of Present Illness: Judith [name] is a 22 y.o.  female  with a history of macromastia.  She presents for preoperative evaluation for upcoming procedure, Bilateral Breast Reduction with liposuction, scheduled for 06/17/2022 with Dr.  Marla Roe  The patient has never had anesthesia before.  She denies any personal or family history of breast cancer.  She states she has never had a mammogram or need for mammogram.  Patient denies any cardiac diseases.  She states that she is not a smoker.  Patient denies taking any birth control or hormone replacement.  She denies any history of miscarriages.  She denies any personal or family history of blood clots or clotting diseases.  She denies any recent traumas, surgeries, infections or hospitalizations.  She denies any history of stroke or heart attack.  She denies any history of Crohn's disease or ulcerative colitis.  She denies any history of asthma.  She denies any history of cancer.  She denies any varicose veins or swollen legs.  She denies any recent fevers, chills or changes in her health.  Patient states that she is unsure of her current exact bra size, but she believes she is probably at the or a DD cup.  Patient states that she would like to be around a C cup.  I discussed with the patient the limitations of surgery and how much tissue can be removed at the time of surgery and that cup size cannot be guaranteed.  Patient expressed understanding.  Summary of Previous Visit: Patient was seen by Dr. Marla Roe on 01/27/2022.  At this visit, she complained of back pain and neck pain due to her enlarged breasts.  She also reported that her shoulder straps were causing grooving.  On exam, the STN on the right was 27 centimeters and 28 cm on the left.  Patient's BMI was  25.9 kg/m.  Patient was unsure of her preoperative bra size, but was likely a D-DD cup.  Patient reported that she would like to be a C cup.  Patient was found to be a good candidate for bilateral breast reduction.  Estimated excess breast tissue to be removed at time of surgery: 250 grams bilaterally  Job: Works as a Scientist, water quality at [employer], planning to take 1 week off.  PMH Significant for: Symptomatic macromastia, denies any chronic diseases or conditions, denies taking any medications   Past Medical History: Allergies: No Known Allergies  Current Medications:  Current Outpatient Medications:    metroNIDAZOLE (FLAGYL) 500 MG tablet, Take 1 tablet (500 mg total) by mouth 2 (two) times daily., Disp: 14 tablet, Rfl: 0  Past Medical Problems: Past Medical History:  Diagnosis Date   Hidradenitis suppurativa    Irregular periods     Past Surgical History: No past surgical history on file.  Social History: Social History   Socioeconomic History   Marital status: Single    Spouse name: Not on file   Number of children: Not on file   Years of education: Not on file   Highest education level: Not on file  Occupational History   Not on file  Tobacco Use   Smoking status: Never   Smokeless tobacco: Never  Vaping Use   Vaping Use: Former   Substances:  Flavoring  Substance and Sexual Activity   Alcohol use: No   Drug use: No   Sexual activity: Yes    Partners: Male    Birth control/protection: None  Other Topics Concern   Not on file  Social History Narrative   Not on file   Social Determinants of Health   Financial Resource Strain: Not on file  Food Insecurity: Not on file  Transportation Needs: Not on file  Physical Activity: Not on file  Stress: Not on file  Social Connections: Not on file  Intimate Partner Violence: Not on file    Family History: Family History  Problem Relation Age of Onset   Asthma Mother    Diabetes Mother    Kidney failure Father      Review of Systems: Denies any fevers, chills or recent changes in her health  Physical Exam: Vital Signs There were no vitals taken for this visit.  Physical Exam  Constitutional:      General: Not in acute distress.    Appearance: Normal appearance. Not ill-appearing.  HENT:     Head: Normocephalic and atraumatic.  Neck:     Musculoskeletal: Normal range of motion.  Cardiovascular:     Rate and Rhythm: Normal rate Pulmonary:     Effort: Pulmonary effort is normal. No respiratory distress.  Musculoskeletal: Normal range of motion.  Skin:    General: Skin is warm and dry.     Findings: No erythema or rash.  Neurological:     Mental Status: Alert and oriented to person, place, and time. Mental status is at baseline.  Psychiatric:        Mood and Affect: Mood normal.        Behavior: Behavior normal.    Assessment/Plan: The patient is scheduled for bilateral breast reduction with Dr. Marla Roe.  Risks, benefits, and alternatives of procedure discussed, questions answered and consent obtained.    Smoking Status: Non-smoker; Counseling Given?  N/A Last Mammogram: N/A; Results: N/A  Caprini Score: 3; Risk Factors include: BMI > 25, and length of planned surgery. Recommendation for mechanical prophylaxis. Encourage early ambulation.   Pictures obtained: @consult$   Post-op Rx sent to pharmacy: Oxycodone, Zofran, Keflex  I discussed with the patient to hold ibuprofen, multivitamins and dietary supplements at least 1 week prior to surgery.  Patient expressed understanding.  Patient was provided with the breast reduction and General Surgical Risk consent document and Pain Medication Agreement prior to their appointment.  They had adequate time to read through the risk consent documents and Pain Medication Agreement. We also discussed them in person together during this preop appointment. All of their questions were answered to their satisfaction.  Recommended calling if they  have any further questions.  Risk consent form and Pain Medication Agreement to be scanned into patient's chart.  The risk that can be encountered with breast reduction were discussed and include the following but not limited to these:  Breast asymmetry, fluid accumulation, firmness of the breast, inability to breast feed, loss of nipple or areola, skin loss, decrease or no nipple sensation, fat necrosis of the breast tissue, bleeding, infection, healing delay.  There are risks of anesthesia, changes to skin sensation and injury to nerves or blood vessels.  The muscle can be temporarily or permanently injured.  You may have an allergic reaction to tape, suture, glue, blood products which can result in skin discoloration, swelling, pain, skin lesions, poor healing.  Any of these can lead to the need for  revisonal surgery or stage procedures.  A reduction has potential to interfere with diagnostic procedures.  Nipple or breast piercing can increase risks of infection.  This procedure is best done when the breast is fully developed.  Changes in the breast will continue to occur over time.  Pregnancy can alter the outcomes of previous breast reduction surgery, weight gain and weigh loss can also effect the long term appearance.     Electronically signed by: Clance Boll, PA-C 06/04/2022 7:56 AM

## 2022-06-04 NOTE — Addendum Note (Signed)
Addended byPenni Bombard, Amiliah Campisi on: 06/04/2022 02:41 PM   Modules accepted: Orders

## 2022-06-10 ENCOUNTER — Other Ambulatory Visit: Payer: Self-pay

## 2022-06-10 ENCOUNTER — Encounter (HOSPITAL_BASED_OUTPATIENT_CLINIC_OR_DEPARTMENT_OTHER): Payer: Self-pay | Admitting: Plastic Surgery

## 2022-06-17 ENCOUNTER — Ambulatory Visit (HOSPITAL_BASED_OUTPATIENT_CLINIC_OR_DEPARTMENT_OTHER): Payer: Medicaid Other | Admitting: Anesthesiology

## 2022-06-17 ENCOUNTER — Other Ambulatory Visit: Payer: Self-pay

## 2022-06-17 ENCOUNTER — Encounter (HOSPITAL_BASED_OUTPATIENT_CLINIC_OR_DEPARTMENT_OTHER): Payer: Self-pay | Admitting: Plastic Surgery

## 2022-06-17 ENCOUNTER — Ambulatory Visit (HOSPITAL_BASED_OUTPATIENT_CLINIC_OR_DEPARTMENT_OTHER)
Admission: RE | Admit: 2022-06-17 | Discharge: 2022-06-17 | Disposition: A | Discharge status: Home / Self Care | Payer: Medicaid Other | Attending: Plastic Surgery | Admitting: Plastic Surgery

## 2022-06-17 ENCOUNTER — Encounter (HOSPITAL_BASED_OUTPATIENT_CLINIC_OR_DEPARTMENT_OTHER)
Admission: RE | Disposition: A | Discharge status: Home / Self Care | Payer: Self-pay | Source: Home / Self Care | Attending: Plastic Surgery

## 2022-06-17 DIAGNOSIS — Z01818 Encounter for other preprocedural examination: Secondary | ICD-10-CM

## 2022-06-17 DIAGNOSIS — Z538 Procedure and treatment not carried out for other reasons: Secondary | ICD-10-CM | POA: Diagnosis not present

## 2022-06-17 DIAGNOSIS — L732 Hidradenitis suppurativa: Secondary | ICD-10-CM

## 2022-06-17 DIAGNOSIS — N62 Hypertrophy of breast: Secondary | ICD-10-CM | POA: Insufficient documentation

## 2022-06-17 HISTORY — PX: BREAST REDUCTION SURGERY: SHX8

## 2022-06-17 LAB — POCT PREGNANCY, URINE: Preg Test, Ur: NEGATIVE

## 2022-06-17 SURGERY — BREAST REDUCTION WITH LIPOSUCTION
Anesthesia: General | Site: Breast | Laterality: Bilateral

## 2022-06-17 MED ORDER — ACETAMINOPHEN 500 MG PO TABS
1000.0000 mg | ORAL_TABLET | Freq: Once | ORAL | Status: AC
  Administered 2022-06-17: 1000 mg via ORAL

## 2022-06-17 MED ORDER — DEXAMETHASONE SODIUM PHOSPHATE 4 MG/ML IJ SOLN
INTRAMUSCULAR | Status: DC | PRN
  Administered 2022-06-17: 10 mg via INTRAVENOUS

## 2022-06-17 MED ORDER — ONDANSETRON HCL 4 MG/2ML IJ SOLN
INTRAMUSCULAR | Status: AC
  Filled 2022-06-17: qty 2

## 2022-06-17 MED ORDER — FENTANYL CITRATE (PF) 100 MCG/2ML IJ SOLN
INTRAMUSCULAR | Status: AC
  Filled 2022-06-17: qty 2

## 2022-06-17 MED ORDER — ROCURONIUM BROMIDE 10 MG/ML (PF) SYRINGE
PREFILLED_SYRINGE | INTRAVENOUS | Status: AC
  Filled 2022-06-17: qty 10

## 2022-06-17 MED ORDER — SCOPOLAMINE 1 MG/3DAYS TD PT72
1.0000 | MEDICATED_PATCH | TRANSDERMAL | Status: DC
  Administered 2022-06-17: 1.5 mg via TRANSDERMAL

## 2022-06-17 MED ORDER — ONDANSETRON HCL 4 MG/2ML IJ SOLN
INTRAMUSCULAR | Status: DC | PRN
  Administered 2022-06-17: 4 mg via INTRAVENOUS

## 2022-06-17 MED ORDER — CHLORHEXIDINE GLUCONATE CLOTH 2 % EX PADS: 6.0000 | MEDICATED_PAD | Freq: Once | CUTANEOUS | Status: DC

## 2022-06-17 MED ORDER — MIDAZOLAM HCL 5 MG/5ML IJ SOLN
INTRAMUSCULAR | Status: DC | PRN
  Administered 2022-06-17: 2 mg via INTRAVENOUS

## 2022-06-17 MED ORDER — PHENYLEPHRINE HCL (PRESSORS) 10 MG/ML IV SOLN
INTRAVENOUS | Status: DC | PRN
  Administered 2022-06-17: 40 ug via INTRAVENOUS

## 2022-06-17 MED ORDER — CEFAZOLIN SODIUM-DEXTROSE 2-4 GM/100ML-% IV SOLN
2.0000 g | INTRAVENOUS | Status: AC
  Administered 2022-06-17: 2 g via INTRAVENOUS

## 2022-06-17 MED ORDER — SUGAMMADEX SODIUM 500 MG/5ML IV SOLN
INTRAVENOUS | Status: DC | PRN
  Administered 2022-06-17: 250 mg via INTRAVENOUS

## 2022-06-17 MED ORDER — MIDAZOLAM HCL 2 MG/2ML IJ SOLN
INTRAMUSCULAR | Status: AC
  Filled 2022-06-17: qty 2

## 2022-06-17 MED ORDER — SCOPOLAMINE 1 MG/3DAYS TD PT72
MEDICATED_PATCH | TRANSDERMAL | Status: AC
  Filled 2022-06-17: qty 1

## 2022-06-17 MED ORDER — ROCURONIUM BROMIDE 100 MG/10ML IV SOLN
INTRAVENOUS | Status: DC | PRN
  Administered 2022-06-17: 40 mg via INTRAVENOUS

## 2022-06-17 MED ORDER — DEXAMETHASONE SODIUM PHOSPHATE 10 MG/ML IJ SOLN
INTRAMUSCULAR | Status: AC
  Filled 2022-06-17: qty 1

## 2022-06-17 MED ORDER — LIDOCAINE HCL (CARDIAC) PF 100 MG/5ML IV SOSY
PREFILLED_SYRINGE | INTRAVENOUS | Status: DC | PRN
  Administered 2022-06-17: 60 mg via INTRAVENOUS

## 2022-06-17 MED ORDER — LIDOCAINE HCL (PF) 1 % IJ SOLN
INTRAMUSCULAR | Status: AC
  Filled 2022-06-17: qty 60

## 2022-06-17 MED ORDER — FENTANYL CITRATE (PF) 100 MCG/2ML IJ SOLN
INTRAMUSCULAR | Status: DC | PRN
  Administered 2022-06-17: 50 ug via INTRAVENOUS

## 2022-06-17 MED ORDER — ACETAMINOPHEN 500 MG PO TABS
ORAL_TABLET | ORAL | Status: AC
  Filled 2022-06-17: qty 2

## 2022-06-17 MED ORDER — LIDOCAINE 2% (20 MG/ML) 5 ML SYRINGE
INTRAMUSCULAR | Status: AC
  Filled 2022-06-17: qty 5

## 2022-06-17 MED ORDER — PROPOFOL 10 MG/ML IV BOLUS
INTRAVENOUS | Status: DC | PRN
  Administered 2022-06-17: 140 mg via INTRAVENOUS

## 2022-06-17 MED ORDER — PROPOFOL 10 MG/ML IV BOLUS
INTRAVENOUS | Status: AC
  Filled 2022-06-17: qty 20

## 2022-06-17 MED ORDER — LACTATED RINGERS IV SOLN: INTRAVENOUS | Status: DC

## 2022-06-17 MED ORDER — CEFAZOLIN SODIUM-DEXTROSE 2-4 GM/100ML-% IV SOLN
INTRAVENOUS | Status: AC
  Filled 2022-06-17: qty 100

## 2022-06-17 SURGICAL SUPPLY — 62 items
ADH SKN CLS APL DERMABOND .7 (GAUZE/BANDAGES/DRESSINGS)
BAG DECANTER FOR FLEXI CONT (MISCELLANEOUS) ×1 IMPLANT
BINDER BREAST LRG (GAUZE/BANDAGES/DRESSINGS) IMPLANT
BINDER BREAST MEDIUM (GAUZE/BANDAGES/DRESSINGS) IMPLANT
BINDER BREAST XLRG (GAUZE/BANDAGES/DRESSINGS) IMPLANT
BINDER BREAST XXLRG (GAUZE/BANDAGES/DRESSINGS) IMPLANT
BIOPATCH RED 1 DISK 7.0 (GAUZE/BANDAGES/DRESSINGS) IMPLANT
BLADE HEX COATED 2.75 (ELECTRODE) IMPLANT
BLADE KNIFE PERSONA 10 (BLADE) ×2 IMPLANT
BLADE SURG 15 STRL LF DISP TIS (BLADE) ×1 IMPLANT
BLADE SURG 15 STRL SS (BLADE)
CANISTER SUCT 1200ML W/VALVE (MISCELLANEOUS) ×1 IMPLANT
COVER BACK TABLE 60X90IN (DRAPES) ×1 IMPLANT
COVER MAYO STAND STRL (DRAPES) ×1 IMPLANT
DERMABOND ADVANCED .7 DNX12 (GAUZE/BANDAGES/DRESSINGS) ×2 IMPLANT
DRAIN CHANNEL 19F RND (DRAIN) IMPLANT
DRAPE LAPAROSCOPIC ABDOMINAL (DRAPES) ×1 IMPLANT
DRSG MEPILEX POST OP 4X8 (GAUZE/BANDAGES/DRESSINGS) ×2 IMPLANT
ELECT BLADE 4.0 EZ CLEAN MEGAD (MISCELLANEOUS)
ELECT REM PT RETURN 9FT ADLT (ELECTROSURGICAL)
ELECTRODE BLDE 4.0 EZ CLN MEGD (MISCELLANEOUS) ×1 IMPLANT
ELECTRODE REM PT RTRN 9FT ADLT (ELECTROSURGICAL) ×1 IMPLANT
EVACUATOR SILICONE 100CC (DRAIN) IMPLANT
GAUZE PAD ABD 8X10 STRL (GAUZE/BANDAGES/DRESSINGS) ×2 IMPLANT
GLOVE BIO SURGEON STRL SZ 6.5 (GLOVE) ×3 IMPLANT
GLOVE BIO SURGEON STRL SZ7.5 (GLOVE) ×1 IMPLANT
GOWN STRL REUS W/ TWL LRG LVL3 (GOWN DISPOSABLE) ×2 IMPLANT
GOWN STRL REUS W/ TWL XL LVL3 (GOWN DISPOSABLE) ×1 IMPLANT
GOWN STRL REUS W/TWL LRG LVL3 (GOWN DISPOSABLE)
GOWN STRL REUS W/TWL XL LVL3 (GOWN DISPOSABLE)
NDL FILTER BLUNT 18X1 1/2 (NEEDLE) IMPLANT
NDL HYPO 25X1 1.5 SAFETY (NEEDLE) ×1 IMPLANT
NEEDLE FILTER BLUNT 18X1 1/2 (NEEDLE) IMPLANT
NEEDLE HYPO 25X1 1.5 SAFETY (NEEDLE) IMPLANT
NS IRRIG 1000ML POUR BTL (IV SOLUTION) ×1 IMPLANT
PACK BASIN DAY SURGERY FS (CUSTOM PROCEDURE TRAY) ×1 IMPLANT
PAD ALCOHOL SWAB (MISCELLANEOUS) IMPLANT
PAD FOAM SILICONE BACKED (GAUZE/BANDAGES/DRESSINGS) IMPLANT
PENCIL SMOKE EVACUATOR (MISCELLANEOUS) ×1 IMPLANT
PIN SAFETY STERILE (MISCELLANEOUS) IMPLANT
SLEEVE SCD COMPRESS KNEE MED (STOCKING) ×1 IMPLANT
SPIKE FLUID TRANSFER (MISCELLANEOUS) IMPLANT
SPONGE T-LAP 18X18 ~~LOC~~+RFID (SPONGE) ×2 IMPLANT
STRIP SUTURE WOUND CLOSURE 1/2 (MISCELLANEOUS) ×2 IMPLANT
SUT MNCRL AB 4-0 PS2 18 (SUTURE) ×4 IMPLANT
SUT MON AB 3-0 SH 27 (SUTURE)
SUT MON AB 3-0 SH27 (SUTURE) ×4 IMPLANT
SUT MON AB 5-0 PS2 18 (SUTURE) IMPLANT
SUT PDS 3-0 CT2 (SUTURE)
SUT PDS II 3-0 CT2 27 ABS (SUTURE) ×4 IMPLANT
SUT SILK 3 0 PS 1 (SUTURE) IMPLANT
SYR 50ML LL SCALE MARK (SYRINGE) IMPLANT
SYR BULB IRRIG 60ML STRL (SYRINGE) ×1 IMPLANT
SYR CONTROL 10ML LL (SYRINGE) ×1 IMPLANT
TAPE MEASURE VINYL STERILE (MISCELLANEOUS) IMPLANT
TOWEL GREEN STERILE FF (TOWEL DISPOSABLE) ×2 IMPLANT
TRAY DSU PREP LF (CUSTOM PROCEDURE TRAY) ×1 IMPLANT
TUBE CONNECTING 20X1/4 (TUBING) ×1 IMPLANT
TUBING INFILTRATION IT-10001 (TUBING) IMPLANT
TUBING SET GRADUATE ASPIR 12FT (MISCELLANEOUS) IMPLANT
UNDERPAD 30X36 HEAVY ABSORB (UNDERPADS AND DIAPERS) ×2 IMPLANT
YANKAUER SUCT BULB TIP NO VENT (SUCTIONS) ×1 IMPLANT

## 2022-06-17 NOTE — Discharge Instructions (Addendum)
VASHE  Vashe Wound Solution is a wound cleanser containing Pure Hypochlorous Acid: a vital molecule produced by the human body's own immune system when fighting harmful bacteria and infection.   Vashe is backed by more Level 1 evidence, consensus guidelines, and published clinical science than any other branded wound cleanser on the market.  Can be used on acute and chronic wounds, dermal lesions, stage I-IV pressure injuries, diabetic ulcers, post-surgical wounds, burns, abrasions and donor sites.  There are no clinical contraindications for use. Place the Vashe on gauze and then put the gauze on the effected area for 10 min.  Then use Vaseline and gauze.    Produced by a proprietary electrochemical process, ensuring a pH no greater than 5.5, the same pH level as healthy, healing skin.  Hypochlorous acid is produced by the human body to neutralize invading pathogens. After a pathogen enters the body, neutrophils are quickly deployed from the bloodstream to respond. It's called the oxidative burst pathway, and it looks like this:    Where to Get Vashe:  [place] or [facility] For additional information you can call [phone] or visit [email or web address]  May take Tylenol after 4pm, if needed.   Post Anesthesia Home Care Instructions  Activity: Get plenty of rest for the remainder of the day. A responsible individual must stay with you for 24 hours following the procedure.  For the next 24 hours, DO NOT: -Drive a car -Paediatric nurse -Drink alcoholic beverages -Take any medication unless instructed by your physician -Make any legal decisions or sign important papers.  Meals: Start with liquid foods such as gelatin or soup. Progress to regular foods as tolerated. Avoid greasy, spicy, heavy foods. If nausea and/or vomiting occur, drink only clear liquids until the nausea and/or vomiting subsides. Call your physician if vomiting continues.  Special Instructions/Symptoms: Your  throat may feel dry or sore from the anesthesia or the breathing tube placed in your throat during surgery. If this causes discomfort, gargle with warm salt water. The discomfort should disappear within 24 hours.  If you had a scopolamine patch placed behind your ear for the management of post- operative nausea and/or vomiting:  1. The medication in the patch is effective for 72 hours, after which it should be removed.  Wrap patch in a tissue and discard in the trash. Wash hands thoroughly with soap and water. 2. You may remove the patch earlier than 72 hours if you experience unpleasant side effects which may include dry mouth, dizziness or visual disturbances. 3. Avoid touching the patch. Wash your hands with soap and water after contact with the patch.

## 2022-06-17 NOTE — Anesthesia Preprocedure Evaluation (Addendum)
Anesthesia Evaluation  Patient identified by MRN, date of birth, ID band Patient awake    Reviewed: Allergy & Precautions, NPO status , Patient's Chart, lab work & pertinent test results  History of Anesthesia Complications Negative for: history of anesthetic complications  Airway Mallampati: I  TM Distance: >3 FB Neck ROM: Full    Dental  (+) Teeth Intact, Dental Advisory Given   Pulmonary neg pulmonary ROS   Pulmonary exam normal breath sounds clear to auscultation       Cardiovascular negative cardio ROS  Rhythm:Regular Rate:Normal     Neuro/Psych negative neurological ROS     GI/Hepatic negative GI ROS, Neg liver ROS,,,  Endo/Other  negative endocrine ROS    Renal/GU negative Renal ROS     Musculoskeletal   Abdominal   Peds  Hematology negative hematology ROS (+)   Anesthesia Other Findings macromastia  Reproductive/Obstetrics                             Anesthesia Physical Anesthesia Plan  ASA: 1  Anesthesia Plan: General   Post-op Pain Management: Tylenol PO (pre-op)*   Induction: Intravenous  PONV Risk Score and Plan: 3 and Ondansetron, Dexamethasone, Midazolam, Scopolamine patch - Pre-op and Treatment may vary due to age or medical condition  Airway Management Planned: Oral ETT  Additional Equipment:   Intra-op Plan:   Post-operative Plan: Extubation in OR  Informed Consent: I have reviewed the patients History and Physical, chart, labs and discussed the procedure including the risks, benefits and alternatives for the proposed anesthesia with the patient or authorized representative who has indicated his/her understanding and acceptance.     Dental advisory given  Plan Discussed with: CRNA and Anesthesiologist  Anesthesia Plan Comments: (Risks of general anesthesia discussed including, but not limited to, sore throat, hoarse voice, chipped/damaged teeth, injury  to vocal cords, nausea and vomiting, allergic reactions, lung infection, heart attack, stroke, and death. All questions answered. )        Anesthesia Quick Evaluation

## 2022-06-17 NOTE — Interval H&P Note (Signed)
History and Physical Interval Note:  06/17/2022 10:19 AM  Judith [name]  has presented today for surgery, with the diagnosis of macromastia.  The various methods of treatment have been discussed with the patient and family. After consideration of risks, benefits and other options for treatment, the patient has consented to  Procedure(s): BREAST REDUCTION WITH LIPOSUCTION (Bilateral) as a surgical intervention.  The patient's history has been reviewed, patient examined, no change in status, stable for surgery.  I have reviewed the patient's chart and labs.  Questions were answered to the patient's satisfaction.     Loel Lofty Barry [place]
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
## 2022-06-17 NOTE — Transfer of Care (Signed)
Immediate Anesthesia Transfer of Care Note  Patient: Judith [name]  Procedure(s) Performed: BREAST REDUCTION WITH LIPOSUCTION (Bilateral: Breast)  Patient Location: PACU  Anesthesia Type:General  Level of Consciousness: drowsy, patient cooperative, and responds to stimulation  Airway & Oxygen Therapy: Patient Spontanous Breathing and Patient connected to face mask oxygen  Post-op Assessment: Report given to RN and Post -op Vital signs reviewed and stable  Post vital signs: Reviewed and stable  Last Vitals:  Vitals Value Taken Time  BP    Temp 36.7 C 06/17/22 1145  Pulse    Resp    SpO2      Last Pain:  Vitals:   06/17/22 1001  TempSrc: Oral  PainSc: 0-No pain      Patients Stated Pain Goal: 5 ([id number])  Complications: No notable events documented.

## 2022-06-17 NOTE — Anesthesia Postprocedure Evaluation (Signed)
Anesthesia Post Note  Patient: Judith [name]  Procedure(s) Performed: BREAST REDUCTION WITH LIPOSUCTION (Bilateral: Breast)     Patient location during evaluation: PACU Anesthesia Type: General Level of consciousness: awake Pain management: pain level controlled Vital Signs Assessment: post-procedure vital signs reviewed and stable Respiratory status: spontaneous breathing, nonlabored ventilation and respiratory function stable Cardiovascular status: blood pressure returned to baseline and stable Postop Assessment: no apparent nausea or vomiting Anesthetic complications: no   No notable events documented.  Last Vitals:  Vitals:   06/17/22 1146 06/17/22 1200  BP:  117/78  Pulse:  97  Resp:  20  Temp:    SpO2: 100% 99%    Last Pain:  Vitals:   06/17/22 1200  TempSrc:   PainSc: 0-No pain                 Nilda Simmer

## 2022-06-17 NOTE — Anesthesia Procedure Notes (Signed)
Procedure Name: Intubation Date/Time: 06/17/2022 11:13 AM  Performed by: Glory Buff, CRNAPre-anesthesia Checklist: Patient identified, Emergency Drugs available, Suction available and Patient being monitored Patient Re-evaluated:Patient Re-evaluated prior to induction Oxygen Delivery Method: Circle system utilized Preoxygenation: Pre-oxygenation with 100% oxygen Induction Type: IV induction Ventilation: Mask ventilation without difficulty Laryngoscope Size: Mac and 3 Grade View: Grade I Tube type: Oral Tube size: 7.0 mm Number of attempts: 1 Airway Equipment and Method: Stylet and Bite block Placement Confirmation: ETT inserted through vocal cords under direct vision, positive ETCO2 and breath sounds checked- equal and bilateral Secured at: 21 cm Tube secured with: Tape Dental Injury: Teeth and Oropharynx as per pre-operative assessment

## 2022-06-17 NOTE — OR Nursing (Signed)
PROCEDURE CANCELED PER DR. Marla Roe

## 2022-06-18 ENCOUNTER — Encounter (HOSPITAL_BASED_OUTPATIENT_CLINIC_OR_DEPARTMENT_OTHER): Payer: Self-pay | Admitting: Plastic Surgery

## 2022-06-18 ENCOUNTER — Telehealth: Payer: Self-pay | Admitting: Plastic Surgery

## 2022-06-18 NOTE — Telephone Encounter (Signed)
Pt called to inform provider of her preferred days to have surgery. If possible she would like any day from March 6 to March 15.  Please call pt [phone]

## 2022-06-26 ENCOUNTER — Encounter: Payer: Medicaid Other | Admitting: Plastic Surgery

## 2022-06-28 ENCOUNTER — Emergency Department (HOSPITAL_COMMUNITY): Payer: Medicaid Other

## 2022-06-28 ENCOUNTER — Inpatient Hospital Stay (HOSPITAL_COMMUNITY)
Admission: EM | Admit: 2022-06-28 | Discharge: 2022-07-20 | DRG: 957 | Disposition: A | Discharge status: Home / Self Care | Payer: Medicaid Other | Attending: General Surgery | Admitting: General Surgery

## 2022-06-28 ENCOUNTER — Emergency Department (HOSPITAL_COMMUNITY): Payer: Medicaid Other | Admitting: Registered Nurse

## 2022-06-28 ENCOUNTER — Other Ambulatory Visit: Payer: Self-pay

## 2022-06-28 ENCOUNTER — Encounter (HOSPITAL_COMMUNITY): Payer: Self-pay | Admitting: Emergency Medicine

## 2022-06-28 ENCOUNTER — Inpatient Hospital Stay (HOSPITAL_COMMUNITY): Payer: Medicaid Other

## 2022-06-28 ENCOUNTER — Encounter (HOSPITAL_COMMUNITY): Admission: EM | Disposition: A | Discharge status: Home / Self Care | Payer: Self-pay | Source: Home / Self Care

## 2022-06-28 DIAGNOSIS — S3600XA Unspecified injury of spleen, initial encounter: Secondary | ICD-10-CM | POA: Diagnosis not present

## 2022-06-28 DIAGNOSIS — S27809A Unspecified injury of diaphragm, initial encounter: Secondary | ICD-10-CM

## 2022-06-28 DIAGNOSIS — S2232XB Fracture of one rib, left side, initial encounter for open fracture: Secondary | ICD-10-CM

## 2022-06-28 DIAGNOSIS — S3639XA Other injury of stomach, initial encounter: Secondary | ICD-10-CM

## 2022-06-28 DIAGNOSIS — N179 Acute kidney failure, unspecified: Secondary | ICD-10-CM

## 2022-06-28 DIAGNOSIS — Z9889 Other specified postprocedural states: Secondary | ICD-10-CM

## 2022-06-28 DIAGNOSIS — S3630XA Unspecified injury of stomach, initial encounter: Secondary | ICD-10-CM | POA: Diagnosis not present

## 2022-06-28 DIAGNOSIS — J942 Hemothorax: Secondary | ICD-10-CM

## 2022-06-28 DIAGNOSIS — W3400XA Accidental discharge from unspecified firearms or gun, initial encounter: Principal | ICD-10-CM

## 2022-06-28 DIAGNOSIS — S36039A Unspecified laceration of spleen, initial encounter: Secondary | ICD-10-CM

## 2022-06-28 DIAGNOSIS — D649 Anemia, unspecified: Secondary | ICD-10-CM

## 2022-06-28 DIAGNOSIS — E876 Hypokalemia: Secondary | ICD-10-CM | POA: Diagnosis not present

## 2022-06-28 DIAGNOSIS — S21442A Puncture wound with foreign body of left back wall of thorax with penetration into thoracic cavity, initial encounter: Secondary | ICD-10-CM | POA: Diagnosis present

## 2022-06-28 DIAGNOSIS — R0602 Shortness of breath: Secondary | ICD-10-CM | POA: Diagnosis present

## 2022-06-28 DIAGNOSIS — R339 Retention of urine, unspecified: Secondary | ICD-10-CM | POA: Diagnosis not present

## 2022-06-28 DIAGNOSIS — S31641A Puncture wound with foreign body of abdominal wall, left upper quadrant with penetration into peritoneal cavity, initial encounter: Secondary | ICD-10-CM | POA: Diagnosis present

## 2022-06-28 DIAGNOSIS — Y904 Blood alcohol level of 80-99 mg/100 ml: Secondary | ICD-10-CM | POA: Diagnosis present

## 2022-06-28 DIAGNOSIS — N611 Abscess of the breast and nipple: Secondary | ICD-10-CM | POA: Diagnosis not present

## 2022-06-28 DIAGNOSIS — J9811 Atelectasis: Secondary | ICD-10-CM | POA: Diagnosis not present

## 2022-06-28 DIAGNOSIS — L732 Hidradenitis suppurativa: Secondary | ICD-10-CM | POA: Diagnosis present

## 2022-06-28 DIAGNOSIS — S2002XA Contusion of left breast, initial encounter: Secondary | ICD-10-CM | POA: Diagnosis present

## 2022-06-28 DIAGNOSIS — S3609XA Other injury of spleen, initial encounter: Secondary | ICD-10-CM | POA: Diagnosis present

## 2022-06-28 DIAGNOSIS — F10129 Alcohol abuse with intoxication, unspecified: Secondary | ICD-10-CM | POA: Diagnosis present

## 2022-06-28 DIAGNOSIS — J869 Pyothorax without fistula: Secondary | ICD-10-CM | POA: Diagnosis not present

## 2022-06-28 DIAGNOSIS — Y249XXA Unspecified firearm discharge, undetermined intent, initial encounter: Secondary | ICD-10-CM

## 2022-06-28 DIAGNOSIS — D75838 Other thrombocytosis: Secondary | ICD-10-CM | POA: Diagnosis present

## 2022-06-28 DIAGNOSIS — D62 Acute posthemorrhagic anemia: Secondary | ICD-10-CM | POA: Diagnosis present

## 2022-06-28 DIAGNOSIS — J90 Pleural effusion, not elsewhere classified: Secondary | ICD-10-CM | POA: Diagnosis not present

## 2022-06-28 DIAGNOSIS — K567 Ileus, unspecified: Secondary | ICD-10-CM | POA: Diagnosis not present

## 2022-06-28 DIAGNOSIS — K658 Other peritonitis: Secondary | ICD-10-CM | POA: Diagnosis present

## 2022-06-28 DIAGNOSIS — Z23 Encounter for immunization: Secondary | ICD-10-CM | POA: Diagnosis not present

## 2022-06-28 DIAGNOSIS — S272XXA Traumatic hemopneumothorax, initial encounter: Secondary | ICD-10-CM | POA: Diagnosis present

## 2022-06-28 HISTORY — PX: SPLENECTOMY, TOTAL: SHX788

## 2022-06-28 HISTORY — DX: Accidental discharge from unspecified firearms or gun, initial encounter: W34.00XA

## 2022-06-28 HISTORY — PX: CHEST TUBE INSERTION: SHX231

## 2022-06-28 HISTORY — PX: LAPAROTOMY: SHX154

## 2022-06-28 LAB — POCT I-STAT 7, (LYTES, BLD GAS, ICA,H+H)
Acid-base deficit: 11 mmol/L — ABNORMAL HIGH (ref 0.0–2.0)
Acid-base deficit: 14 mmol/L — ABNORMAL HIGH (ref 0.0–2.0)
Acid-base deficit: 12 mmol/L — ABNORMAL HIGH (ref 0.0–2.0)
Acid-base deficit: 7 mmol/L — ABNORMAL HIGH (ref 0.0–2.0)
Acid-base deficit: 4 mmol/L — ABNORMAL HIGH (ref 0.0–2.0)
Bicarbonate: 11 mmol/L — ABNORMAL LOW (ref 20.0–28.0)
Bicarbonate: 15.2 mmol/L — ABNORMAL LOW (ref 20.0–28.0)
Bicarbonate: 14.5 mmol/L — ABNORMAL LOW (ref 20.0–28.0)
Bicarbonate: 18.6 mmol/L — ABNORMAL LOW (ref 20.0–28.0)
Bicarbonate: 20.4 mmol/L (ref 20.0–28.0)
Calcium, Ion: 1.05 mmol/L — ABNORMAL LOW (ref 1.15–1.40)
Calcium, Ion: 1.15 mmol/L (ref 1.15–1.40)
Calcium, Ion: 1.12 mmol/L — ABNORMAL LOW (ref 1.15–1.40)
Calcium, Ion: 1.07 mmol/L — ABNORMAL LOW (ref 1.15–1.40)
Calcium, Ion: 1.13 mmol/L — ABNORMAL LOW (ref 1.15–1.40)
HCT: 18 % — ABNORMAL LOW (ref 36.0–46.0)
HCT: 25 % — ABNORMAL LOW (ref 36.0–46.0)
HCT: 35 % — ABNORMAL LOW (ref 36.0–46.0)
HCT: 44 % (ref 36.0–46.0)
HCT: 41 % (ref 36.0–46.0)
Hemoglobin: 6.1 g/dL — CL (ref 12.0–15.0)
Hemoglobin: 8.5 g/dL — ABNORMAL LOW (ref 12.0–15.0)
Hemoglobin: 11.9 g/dL — ABNORMAL LOW (ref 12.0–15.0)
Hemoglobin: 15 g/dL (ref 12.0–15.0)
Hemoglobin: 13.9 g/dL (ref 12.0–15.0)
O2 Saturation: 100 %
O2 Saturation: 100 %
O2 Saturation: 100 %
O2 Saturation: 100 %
O2 Saturation: 100 %
Patient temperature: 98.6
Patient temperature: 98.9
Potassium: 2.9 mmol/L — ABNORMAL LOW (ref 3.5–5.1)
Potassium: 3.4 mmol/L — ABNORMAL LOW (ref 3.5–5.1)
Potassium: 3.6 mmol/L (ref 3.5–5.1)
Potassium: 3.5 mmol/L (ref 3.5–5.1)
Potassium: 3.4 mmol/L — ABNORMAL LOW (ref 3.5–5.1)
Sodium: 142 mmol/L (ref 135–145)
Sodium: 143 mmol/L (ref 135–145)
Sodium: 140 mmol/L (ref 135–145)
Sodium: 141 mmol/L (ref 135–145)
Sodium: 139 mmol/L (ref 135–145)
TCO2: 12 mmol/L — ABNORMAL LOW (ref 22–32)
TCO2: 16 mmol/L — ABNORMAL LOW (ref 22–32)
TCO2: 16 mmol/L — ABNORMAL LOW (ref 22–32)
TCO2: 20 mmol/L — ABNORMAL LOW (ref 22–32)
TCO2: 21 mmol/L — ABNORMAL LOW (ref 22–32)
pCO2 arterial: 22.8 mmHg — ABNORMAL LOW (ref 32–48)
pCO2 arterial: 35.7 mmHg (ref 32–48)
pCO2 arterial: 33 mmHg (ref 32–48)
pCO2 arterial: 37.2 mmHg (ref 32–48)
pCO2 arterial: 33.1 mmHg (ref 32–48)
pH, Arterial: 7.239 — ABNORMAL LOW (ref 7.35–7.45)
pH, Arterial: 7.291 — ABNORMAL LOW (ref 7.35–7.45)
pH, Arterial: 7.253 — ABNORMAL LOW (ref 7.35–7.45)
pH, Arterial: 7.307 — ABNORMAL LOW (ref 7.35–7.45)
pH, Arterial: 7.398 (ref 7.35–7.45)
pO2, Arterial: 204 mmHg — ABNORMAL HIGH (ref 83–108)
pO2, Arterial: 323 mmHg — ABNORMAL HIGH (ref 83–108)
pO2, Arterial: 230 mmHg — ABNORMAL HIGH (ref 83–108)
pO2, Arterial: 548 mmHg — ABNORMAL HIGH (ref 83–108)
pO2, Arterial: 196 mmHg — ABNORMAL HIGH (ref 83–108)

## 2022-06-28 LAB — BLOOD PRODUCT ORDER (VERBAL) VERIFICATION

## 2022-06-28 LAB — LACTIC ACID, PLASMA
Lactic Acid, Venous: >9 mmol/L (ref 0.5–1.9)
Lactic Acid, Venous: 2.6 mmol/L (ref 0.5–1.9)

## 2022-06-28 LAB — PREPARE RBC (CROSSMATCH)

## 2022-06-28 LAB — URINALYSIS, ROUTINE W REFLEX MICROSCOPIC
Bilirubin Urine: NEGATIVE
Glucose, UA: 100 mg/dL — AB
Ketones, ur: NEGATIVE mg/dL
Leukocytes,Ua: NEGATIVE
Nitrite: NEGATIVE
Protein, ur: NEGATIVE mg/dL
Specific Gravity, Urine: 1.01 (ref 1.005–1.030)
pH: 6.5 (ref 5.0–8.0)

## 2022-06-28 LAB — PROTIME-INR
INR: 1 (ref 0.8–1.2)
Prothrombin Time: 13.4 seconds (ref 11.4–15.2)

## 2022-06-28 LAB — CBC
HCT: 34.2 % — ABNORMAL LOW (ref 36.0–46.0)
HCT: 42.9 % (ref 36.0–46.0)
Hemoglobin: 10.9 g/dL — ABNORMAL LOW (ref 12.0–15.0)
Hemoglobin: 14.3 g/dL (ref 12.0–15.0)
MCH: 28.4 pg (ref 26.0–34.0)
MCH: 27.3 pg (ref 26.0–34.0)
MCHC: 31.9 g/dL (ref 30.0–36.0)
MCHC: 33.3 g/dL (ref 30.0–36.0)
MCV: 89.1 fL (ref 80.0–100.0)
MCV: 82 fL (ref 80.0–100.0)
Platelets: 376 10*3/uL (ref 150–400)
Platelets: 295 10*3/uL (ref 150–400)
RBC: 3.84 MIL/uL — ABNORMAL LOW (ref 3.87–5.11)
RBC: 5.23 MIL/uL — ABNORMAL HIGH (ref 3.87–5.11)
RDW: 15.4 % (ref 11.5–15.5)
RDW: 16.6 % — ABNORMAL HIGH (ref 11.5–15.5)
WBC: 11 10*3/uL — ABNORMAL HIGH (ref 4.0–10.5)
WBC: 7.8 10*3/uL (ref 4.0–10.5)
nRBC: 0 % (ref 0.0–0.2)
nRBC: 0 % (ref 0.0–0.2)

## 2022-06-28 LAB — SAMPLE TO BLOOD BANK

## 2022-06-28 LAB — COMPREHENSIVE METABOLIC PANEL
ALT: 20 U/L (ref 0–44)
AST: 32 U/L (ref 15–41)
Albumin: 3.5 g/dL (ref 3.5–5.0)
Alkaline Phosphatase: 70 U/L (ref 38–126)
Anion gap: 21 — ABNORMAL HIGH (ref 5–15)
BUN: 8 mg/dL (ref 6–20)
CO2: 13 mmol/L — ABNORMAL LOW (ref 22–32)
Calcium: 9.4 mg/dL (ref 8.9–10.3)
Chloride: 102 mmol/L (ref 98–111)
Creatinine, Ser: 1.18 mg/dL — ABNORMAL HIGH (ref 0.44–1.00)
GFR, Estimated: >60 mL/min (ref >60)
Glucose, Bld: 192 mg/dL — ABNORMAL HIGH (ref 70–99)
Potassium: 3.1 mmol/L — ABNORMAL LOW (ref 3.5–5.1)
Sodium: 136 mmol/L (ref 135–145)
Total Bilirubin: 0.6 mg/dL (ref 0.3–1.2)
Total Protein: 7.2 g/dL (ref 6.5–8.1)

## 2022-06-28 LAB — I-STAT BETA HCG BLOOD, ED (NOT ORDERABLE): I-stat hCG, quantitative: <5 m[IU]/mL (ref <5)

## 2022-06-28 LAB — PREGNANCY, URINE: Preg Test, Ur: NEGATIVE

## 2022-06-28 LAB — ABO/RH: ABO/RH(D): O POS

## 2022-06-28 LAB — URINALYSIS, MICROSCOPIC (REFLEX): Bacteria, UA: NONE SEEN

## 2022-06-28 LAB — I-STAT CHEM 8, ED
BUN: 8 mg/dL (ref 6–20)
Calcium, Ion: 1.13 mmol/L — ABNORMAL LOW (ref 1.15–1.40)
Chloride: 106 mmol/L (ref 98–111)
Creatinine, Ser: 1.2 mg/dL — ABNORMAL HIGH (ref 0.44–1.00)
Glucose, Bld: 181 mg/dL — ABNORMAL HIGH (ref 70–99)
HCT: 35 % — ABNORMAL LOW (ref 36.0–46.0)
Hemoglobin: 11.9 g/dL — ABNORMAL LOW (ref 12.0–15.0)
Potassium: 3.1 mmol/L — ABNORMAL LOW (ref 3.5–5.1)
Sodium: 141 mmol/L (ref 135–145)
TCO2: 15 mmol/L — ABNORMAL LOW (ref 22–32)

## 2022-06-28 LAB — ETHANOL: Alcohol, Ethyl (B): 90 mg/dL — ABNORMAL HIGH (ref <10)

## 2022-06-28 LAB — MRSA NEXT GEN BY PCR, NASAL: MRSA by PCR Next Gen: NOT DETECTED

## 2022-06-28 SURGERY — LAPAROTOMY, EXPLORATORY
Anesthesia: General

## 2022-06-28 MED ORDER — ROCURONIUM BROMIDE 10 MG/ML (PF) SYRINGE
PREFILLED_SYRINGE | INTRAVENOUS | Status: AC
  Filled 2022-06-28: qty 10

## 2022-06-28 MED ORDER — ORAL CARE MOUTH RINSE: 15.0000 mL | OROMUCOSAL | Status: DC | PRN

## 2022-06-28 MED ORDER — CHLORHEXIDINE GLUCONATE CLOTH 2 % EX PADS
6.0000 | MEDICATED_PAD | Freq: Every day | CUTANEOUS | Status: DC
  Administered 2022-06-28 – 2022-07-20 (×24): 6 via TOPICAL

## 2022-06-28 MED ORDER — PANTOPRAZOLE SODIUM 40 MG PO TBEC: 40.0000 mg | DELAYED_RELEASE_TABLET | Freq: Every day | ORAL | Status: DC

## 2022-06-28 MED ORDER — SODIUM CHLORIDE 0.9 % IV SOLN: Freq: Once | INTRAVENOUS | Status: AC

## 2022-06-28 MED ORDER — ACETAMINOPHEN 10 MG/ML IV SOLN
INTRAVENOUS | Status: AC
  Filled 2022-06-28: qty 100

## 2022-06-28 MED ORDER — ALBUMIN HUMAN 5 % IV SOLN
25.0000 g | Freq: Once | INTRAVENOUS | Status: AC
  Administered 2022-06-28: 25 g via INTRAVENOUS
  Filled 2022-06-28: qty 500

## 2022-06-28 MED ORDER — POLYETHYLENE GLYCOL 3350 17 G PO PACK: 17.0000 g | PACK | Freq: Every day | ORAL | Status: DC

## 2022-06-28 MED ORDER — FENTANYL CITRATE (PF) 250 MCG/5ML IJ SOLN
INTRAMUSCULAR | Status: AC
  Filled 2022-06-28: qty 5

## 2022-06-28 MED ORDER — MIDAZOLAM HCL 2 MG/2ML IJ SOLN
INTRAMUSCULAR | Status: AC
  Filled 2022-06-28: qty 2

## 2022-06-28 MED ORDER — LACTATED RINGERS IV SOLN: INTRAVENOUS | Status: DC | PRN

## 2022-06-28 MED ORDER — LIDOCAINE 2% (20 MG/ML) 5 ML SYRINGE
INTRAMUSCULAR | Status: AC
  Filled 2022-06-28: qty 5

## 2022-06-28 MED ORDER — LIDOCAINE 2% (20 MG/ML) 5 ML SYRINGE
INTRAMUSCULAR | Status: DC | PRN
  Administered 2022-06-28: 100 mg via INTRAVENOUS

## 2022-06-28 MED ORDER — KETAMINE HCL 10 MG/ML IJ SOLN
INTRAMUSCULAR | Status: DC | PRN
  Administered 2022-06-28: 10 mg via INTRAVENOUS
  Administered 2022-06-28: 30 mg via INTRAVENOUS
  Administered 2022-06-28: 10 mg via INTRAVENOUS

## 2022-06-28 MED ORDER — PROPOFOL 1000 MG/100ML IV EMUL
0.0000 ug/kg/min | INTRAVENOUS | Status: DC
  Administered 2022-06-28 – 2022-06-29 (×4): 30 ug/kg/min via INTRAVENOUS
  Filled 2022-06-28: qty 200
  Filled 2022-06-28: qty 100

## 2022-06-28 MED ORDER — POTASSIUM CHLORIDE 10 MEQ/100ML IV SOLN
10.0000 meq | INTRAVENOUS | Status: AC
  Administered 2022-06-28 (×4): 10 meq via INTRAVENOUS
  Filled 2022-06-28 (×4): qty 100

## 2022-06-28 MED ORDER — SUCCINYLCHOLINE CHLORIDE 200 MG/10ML IV SOSY
PREFILLED_SYRINGE | INTRAVENOUS | Status: DC | PRN
  Administered 2022-06-28: 120 mg via INTRAVENOUS

## 2022-06-28 MED ORDER — ACETAMINOPHEN 650 MG RE SUPP: 650.0000 mg | RECTAL | Status: DC | PRN

## 2022-06-28 MED ORDER — POTASSIUM CHLORIDE 2 MEQ/ML IV SOLN
INTRAVENOUS | Status: DC
  Filled 2022-06-28 (×2): qty 1000

## 2022-06-28 MED ORDER — KETAMINE HCL 50 MG/5ML IJ SOSY
PREFILLED_SYRINGE | INTRAMUSCULAR | Status: AC
  Filled 2022-06-28: qty 5

## 2022-06-28 MED ORDER — SUCCINYLCHOLINE CHLORIDE 200 MG/10ML IV SOSY
PREFILLED_SYRINGE | INTRAVENOUS | Status: AC
  Filled 2022-06-28: qty 10

## 2022-06-28 MED ORDER — SODIUM CHLORIDE 0.9% IV SOLUTION
Freq: Once | INTRAVENOUS | Status: AC
  Administered 2022-06-28: 1000 mL via INTRAVENOUS

## 2022-06-28 MED ORDER — ALBUMIN HUMAN 5 % IV SOLN: INTRAVENOUS | Status: DC | PRN

## 2022-06-28 MED ORDER — ROCURONIUM BROMIDE 10 MG/ML (PF) SYRINGE
PREFILLED_SYRINGE | INTRAVENOUS | Status: DC | PRN
  Administered 2022-06-28: 50 mg via INTRAVENOUS
  Administered 2022-06-28: 30 mg via INTRAVENOUS
  Administered 2022-06-28: 20 mg via INTRAVENOUS

## 2022-06-28 MED ORDER — CEFAZOLIN SODIUM-DEXTROSE 2-4 GM/100ML-% IV SOLN
2.0000 g | Freq: Once | INTRAVENOUS | Status: AC
  Administered 2022-06-28: 2 g via INTRAVENOUS

## 2022-06-28 MED ORDER — ONDANSETRON HCL 4 MG/2ML IJ SOLN
INTRAMUSCULAR | Status: AC
  Filled 2022-06-28: qty 2

## 2022-06-28 MED ORDER — FENTANYL 2500MCG IN NS 250ML (10MCG/ML) PREMIX INFUSION
50.0000 ug/h | INTRAVENOUS | Status: DC
  Administered 2022-06-28: 50 ug/h via INTRAVENOUS
  Administered 2022-06-29: 100 ug/h via INTRAVENOUS
  Filled 2022-06-28 (×2): qty 250

## 2022-06-28 MED ORDER — FENTANYL BOLUS VIA INFUSION: 50.0000 ug | INTRAVENOUS | Status: DC | PRN

## 2022-06-28 MED ORDER — ONDANSETRON 4 MG PO TBDP: 4.0000 mg | ORAL_TABLET | Freq: Four times a day (QID) | ORAL | Status: DC | PRN

## 2022-06-28 MED ORDER — SODIUM BICARBONATE 8.4 % IV SOLN
INTRAVENOUS | Status: DC | PRN
  Administered 2022-06-28: 50 meq via INTRAVENOUS

## 2022-06-28 MED ORDER — TETANUS-DIPHTH-ACELL PERTUSSIS 5-2.5-18.5 LF-MCG/0.5 IM SUSY
0.5000 mL | PREFILLED_SYRINGE | Freq: Once | INTRAMUSCULAR | Status: AC
  Administered 2022-06-28: 0.5 mL via INTRAMUSCULAR
  Filled 2022-06-28: qty 0.5

## 2022-06-28 MED ORDER — CALCIUM GLUCONATE-NACL 1-0.675 GM/50ML-% IV SOLN
1.0000 g | Freq: Once | INTRAVENOUS | Status: AC
  Administered 2022-06-28: 1000 mg via INTRAVENOUS
  Filled 2022-06-28: qty 50

## 2022-06-28 MED ORDER — DOCUSATE SODIUM 50 MG/5ML PO LIQD: 100.0000 mg | Freq: Two times a day (BID) | ORAL | Status: DC

## 2022-06-28 MED ORDER — PHENYLEPHRINE HCL-NACL 20-0.9 MG/250ML-% IV SOLN
INTRAVENOUS | Status: DC | PRN
  Administered 2022-06-28: 50 ug/min via INTRAVENOUS

## 2022-06-28 MED ORDER — ONDANSETRON HCL 4 MG/2ML IJ SOLN
4.0000 mg | Freq: Four times a day (QID) | INTRAMUSCULAR | Status: DC | PRN
  Administered 2022-06-28 – 2022-07-03 (×5): 4 mg via INTRAVENOUS
  Filled 2022-06-28 (×5): qty 2

## 2022-06-28 MED ORDER — ONDANSETRON HCL 4 MG/2ML IJ SOLN
INTRAMUSCULAR | Status: DC | PRN
  Administered 2022-06-28: 4 mg via INTRAVENOUS

## 2022-06-28 MED ORDER — PANTOPRAZOLE SODIUM 40 MG IV SOLR
40.0000 mg | Freq: Every day | INTRAVENOUS | Status: DC
  Administered 2022-06-29: 40 mg via INTRAVENOUS
  Filled 2022-06-28: qty 10

## 2022-06-28 MED ORDER — FENTANYL CITRATE PF 50 MCG/ML IJ SOSY
50.0000 ug | PREFILLED_SYRINGE | Freq: Once | INTRAMUSCULAR | Status: AC
  Administered 2022-06-28: 50 ug via INTRAVENOUS

## 2022-06-28 MED ORDER — PROPOFOL 10 MG/ML IV BOLUS
INTRAVENOUS | Status: AC
  Filled 2022-06-28: qty 20

## 2022-06-28 MED ORDER — ORAL CARE MOUTH RINSE: 15.0000 mL | OROMUCOSAL | Status: DC

## 2022-06-28 MED ORDER — PROPOFOL 10 MG/ML IV BOLUS
INTRAVENOUS | Status: DC | PRN
  Administered 2022-06-28: 160 mg via INTRAVENOUS

## 2022-06-28 MED ORDER — DEXAMETHASONE SODIUM PHOSPHATE 10 MG/ML IJ SOLN
INTRAMUSCULAR | Status: DC | PRN
  Administered 2022-06-28: 10 mg via INTRAVENOUS

## 2022-06-28 MED ORDER — ACETAMINOPHEN 10 MG/ML IV SOLN
INTRAVENOUS | Status: DC | PRN
  Administered 2022-06-28: 1000 mg via INTRAVENOUS

## 2022-06-28 MED ORDER — ENOXAPARIN SODIUM 30 MG/0.3ML IJ SOSY
30.0000 mg | PREFILLED_SYRINGE | Freq: Two times a day (BID) | INTRAMUSCULAR | Status: DC
  Administered 2022-06-29 – 2022-07-05 (×14): 30 mg via SUBCUTANEOUS
  Filled 2022-06-28 (×14): qty 0.3

## 2022-06-28 MED ORDER — DEXAMETHASONE SODIUM PHOSPHATE 10 MG/ML IJ SOLN
INTRAMUSCULAR | Status: AC
  Filled 2022-06-28: qty 1

## 2022-06-28 MED ORDER — PROPOFOL 1000 MG/100ML IV EMUL
INTRAVENOUS | Status: AC
  Filled 2022-06-28: qty 100

## 2022-06-28 MED ORDER — PROPOFOL 500 MG/50ML IV EMUL
INTRAVENOUS | Status: DC | PRN
  Administered 2022-06-28: 30 ug/kg/min via INTRAVENOUS

## 2022-06-28 MED ORDER — MIDAZOLAM HCL 2 MG/2ML IJ SOLN
INTRAMUSCULAR | Status: DC | PRN
  Administered 2022-06-28: 2 mg via INTRAVENOUS

## 2022-06-28 MED ORDER — ORAL CARE MOUTH RINSE
15.0000 mL | OROMUCOSAL | Status: DC
  Administered 2022-06-28 – 2022-06-29 (×15): 15 mL via OROMUCOSAL

## 2022-06-28 MED ORDER — FENTANYL CITRATE PF 50 MCG/ML IJ SOSY
50.0000 ug | PREFILLED_SYRINGE | Freq: Once | INTRAMUSCULAR | Status: AC
  Administered 2022-06-28: 50 ug via INTRAVENOUS
  Filled 2022-06-28: qty 1

## 2022-06-28 MED ORDER — FENTANYL CITRATE (PF) 250 MCG/5ML IJ SOLN
INTRAMUSCULAR | Status: DC | PRN
  Administered 2022-06-28: 100 ug via INTRAVENOUS
  Administered 2022-06-28 (×3): 50 ug via INTRAVENOUS

## 2022-06-28 MED ORDER — PIPERACILLIN-TAZOBACTAM 3.375 G IVPB
3.3750 g | Freq: Three times a day (TID) | INTRAVENOUS | Status: AC
  Administered 2022-06-28 – 2022-07-02 (×15): 3.375 g via INTRAVENOUS
  Filled 2022-06-28 (×15): qty 50

## 2022-06-28 MED ORDER — IOHEXOL 350 MG/ML SOLN
75.0000 mL | Freq: Once | INTRAVENOUS | Status: AC | PRN
  Administered 2022-06-28: 75 mL via INTRAVENOUS

## 2022-06-28 SURGICAL SUPPLY — 49 items
APL PRP STRL LF DISP 70% ISPRP (MISCELLANEOUS) ×2
BAG COUNTER SPONGE SURGICOUNT (BAG) ×2 IMPLANT
BAG SPNG CNTER NS LX DISP (BAG) ×2
BLADE CLIPPER SURG (BLADE) IMPLANT
CANISTER SUCT 3000ML PPV (MISCELLANEOUS) ×2 IMPLANT
CATH THORACIC 28FR (CATHETERS) IMPLANT
CHLORAPREP W/TINT 26 (MISCELLANEOUS) ×2 IMPLANT
COVER SURGICAL LIGHT HANDLE (MISCELLANEOUS) ×2 IMPLANT
DRAIN CHANNEL 19F RND (DRAIN) IMPLANT
DRAPE HALF SHEET 40X57 (DRAPES) IMPLANT
DRAPE LAPAROSCOPIC ABDOMINAL (DRAPES) ×2 IMPLANT
DRAPE WARM FLUID 44X44 (DRAPES) ×2 IMPLANT
DRSG OPSITE POSTOP 4X10 (GAUZE/BANDAGES/DRESSINGS) IMPLANT
DRSG OPSITE POSTOP 4X8 (GAUZE/BANDAGES/DRESSINGS) IMPLANT
ELECT BLADE 6.5 EXT (BLADE) IMPLANT
ELECT CAUTERY BLADE 6.4 (BLADE) ×2 IMPLANT
ELECT REM PT RETURN 9FT ADLT (ELECTROSURGICAL) ×2
ELECTRODE REM PT RTRN 9FT ADLT (ELECTROSURGICAL) ×2 IMPLANT
EVACUATOR SILICONE 100CC (DRAIN) IMPLANT
GAUZE PAD ABD 8X10 STRL (GAUZE/BANDAGES/DRESSINGS) IMPLANT
GAUZE SPONGE 4X4 12PLY STRL (GAUZE/BANDAGES/DRESSINGS) IMPLANT
GLOVE BIO SURGEON STRL SZ8 (GLOVE) ×2 IMPLANT
GLOVE BIOGEL PI IND STRL 8 (GLOVE) ×2 IMPLANT
GOWN STRL REUS W/ TWL LRG LVL3 (GOWN DISPOSABLE) ×2 IMPLANT
GOWN STRL REUS W/ TWL XL LVL3 (GOWN DISPOSABLE) ×2 IMPLANT
GOWN STRL REUS W/TWL LRG LVL3 (GOWN DISPOSABLE) ×2
GOWN STRL REUS W/TWL XL LVL3 (GOWN DISPOSABLE) ×2
HANDLE SUCTION POOLE (INSTRUMENTS) ×2 IMPLANT
KIT BASIN OR (CUSTOM PROCEDURE TRAY) ×2 IMPLANT
KIT TURNOVER KIT B (KITS) ×2 IMPLANT
LIGASURE IMPACT 36 18CM CVD LR (INSTRUMENTS) IMPLANT
NS IRRIG 1000ML POUR BTL (IV SOLUTION) ×4 IMPLANT
PACK GENERAL/GYN (CUSTOM PROCEDURE TRAY) ×2 IMPLANT
PAD ARMBOARD 7.5X6 YLW CONV (MISCELLANEOUS) ×2 IMPLANT
PENCIL SMOKE EVACUATOR (MISCELLANEOUS) ×2 IMPLANT
SPECIMEN JAR LARGE (MISCELLANEOUS) IMPLANT
SPONGE T-LAP 18X18 ~~LOC~~+RFID (SPONGE) IMPLANT
STAPLER VISISTAT 35W (STAPLE) ×2 IMPLANT
SUCTION POOLE HANDLE (INSTRUMENTS) ×4
SUT ETHILON 2 0 FS 18 (SUTURE) IMPLANT
SUT PDS AB 1 TP1 96 (SUTURE) ×4 IMPLANT
SUT SILK 2 0 SH (SUTURE) IMPLANT
SUT SILK 2 0 SH CR/8 (SUTURE) ×2 IMPLANT
SUT SILK 2 0 TIES 10X30 (SUTURE) ×2 IMPLANT
SUT SILK 3 0 SH CR/8 (SUTURE) ×2 IMPLANT
SUT SILK 3 0 TIES 10X30 (SUTURE) ×2 IMPLANT
TOWEL GREEN STERILE (TOWEL DISPOSABLE) ×2 IMPLANT
TRAY FOLEY MTR SLVR 16FR STAT (SET/KITS/TRAYS/PACK) IMPLANT
YANKAUER SUCT BULB TIP NO VENT (SUCTIONS) IMPLANT

## 2022-06-28 NOTE — Progress Notes (Addendum)
Pt's Mom Roque Lias Trisha Mangle) and Dad Margarite Gouge) are the 2 designated visitors.  This RN notified the front desk.

## 2022-06-28 NOTE — Progress Notes (Signed)
Attempted to wean to extubation.  Only -11 on NIF, apneic episodes with respiratory therapist.  Will keep intubated today and try for extubation in the morning with more support around.  Felicie Morn, MD General, Bariatric and Minimally Invasive Surgery [facility]
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
## 2022-06-28 NOTE — Progress Notes (Signed)
Orthopedic Tech Progress Note Patient Details:  Judith [name] 26-May-2000 [id number]  Patient ID: [name], female   DOB: 11/24/2000, 22 y.o.   MRN: [id number] I attended trauma page. Karolee Stamps 06/28/2022, 3:08 AM
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
## 2022-06-28 NOTE — Progress Notes (Addendum)
VO from Dr. Grandville Silos for chest tube to be hooked up to suction -20 cm H2O.    Pts hoop nose ring removed and placed in container with pt label.
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
## 2022-06-28 NOTE — Anesthesia Procedure Notes (Signed)
Procedure Name: Intubation Date/Time: 06/28/2022 3:33 AM  Performed by: Trinna Post., CRNAPre-anesthesia Checklist: Patient identified, Emergency Drugs available, Suction available, Patient being monitored and Timeout performed Patient Re-evaluated:Patient Re-evaluated prior to induction Oxygen Delivery Method: Circle system utilized Preoxygenation: Pre-oxygenation with 100% oxygen Induction Type: IV induction, Rapid sequence and Cricoid Pressure applied Laryngoscope Size: Mac and 3 Grade View: Grade I Tube type: Oral Tube size: 7.5 mm Number of attempts: 1 Airway Equipment and Method: Stylet Placement Confirmation: ETT inserted through vocal cords under direct vision, positive ETCO2 and breath sounds checked- equal and bilateral Secured at: 22 cm Tube secured with: Tape Dental Injury: Teeth and Oropharynx as per pre-operative assessment
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
## 2022-06-28 NOTE — Anesthesia Postprocedure Evaluation (Signed)
Anesthesia Post Note  Patient: Judith [name]  Procedure(s) Performed: EXPLORATORY LAPAROTOMY CHEST TUBE INSERTION SPLENECTOMY     Patient location during evaluation: SICU Anesthesia Type: General Level of consciousness: sedated Pain management: pain level controlled Vital Signs Assessment: post-procedure vital signs reviewed and stable Respiratory status: patient remains intubated per anesthesia plan Cardiovascular status: stable Postop Assessment: no apparent nausea or vomiting Anesthetic complications: no   No notable events documented.  Last Vitals:  Vitals:   06/28/22 0315 06/28/22 0612  BP: (!) 134/97   Pulse: (!) 128   Resp: 10   SpO2: 100% 97%    Last Pain:  Vitals:   06/28/22 0240  PainSc: 10-Worst pain ever                 Santa Lighter

## 2022-06-28 NOTE — Transfer of Care (Signed)
Immediate Anesthesia Transfer of Care Note  Patient: Judith [name]  Procedure(s) Performed: EXPLORATORY LAPAROTOMY CHEST TUBE INSERTION SPLENECTOMY  Patient Location: ICU  Anesthesia Type:General  Level of Consciousness: sedated and Patient remains intubated per anesthesia plan  Airway & Oxygen Therapy: Patient remains intubated per anesthesia plan and Patient placed on Ventilator (see vital sign flow sheet for setting)  Post-op Assessment: Report given to RN and Post -op Vital signs reviewed and stable  Post vital signs: Reviewed and stable  Last Vitals:  Vitals Value Taken Time  BP 143/87   Temp    Pulse 97 06/28/22 0617  Resp 13 06/28/22 0617  SpO2 100 % 06/28/22 0617  Vitals shown include unvalidated device data.  Last Pain:  Vitals:   06/28/22 0240  PainSc: 10-Worst pain ever         Complications: No notable events documented.

## 2022-06-28 NOTE — Anesthesia Procedure Notes (Signed)
Arterial Line Insertion Start/End2/25/2024 3:35 AM, 06/28/2022 3:40 AM Performed by: Santa Lighter, MD, anesthesiologist  Patient location: Pre-op. Preanesthetic checklist: patient identified, IV checked, site marked, risks and benefits discussed, surgical consent, monitors and equipment checked, pre-op evaluation, timeout performed and anesthesia consent Lidocaine 1% used for infiltration Left, radial was placed Catheter size: 20 G Hand hygiene performed  and maximum sterile barriers used   Attempts: 1 Procedure performed without using ultrasound guided technique. Following insertion, dressing applied and Biopatch. Post procedure assessment: normal and unchanged  Patient tolerated the procedure well with no immediate complications.
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
## 2022-06-28 NOTE — Progress Notes (Signed)
Dr. Grandville Silos rounding on unit.  Notified him of significant decrease in urinary output.  Albumin order placed by him.  Also informed him of pt's swollen LUE.

## 2022-06-29 ENCOUNTER — Encounter (HOSPITAL_COMMUNITY): Payer: Self-pay | Admitting: General Surgery

## 2022-06-29 LAB — CBC
HCT: 33.1 % — ABNORMAL LOW (ref 36.0–46.0)
Hemoglobin: 11.1 g/dL — ABNORMAL LOW (ref 12.0–15.0)
MCH: 27.7 pg (ref 26.0–34.0)
MCHC: 33.5 g/dL (ref 30.0–36.0)
MCV: 82.5 fL (ref 80.0–100.0)
Platelets: 235 10*3/uL (ref 150–400)
RBC: 4.01 MIL/uL (ref 3.87–5.11)
RDW: 17.6 % — ABNORMAL HIGH (ref 11.5–15.5)
WBC: 12.3 10*3/uL — ABNORMAL HIGH (ref 4.0–10.5)
nRBC: 0 % (ref 0.0–0.2)

## 2022-06-29 LAB — BASIC METABOLIC PANEL
Anion gap: 11 (ref 5–15)
BUN: 5 mg/dL — ABNORMAL LOW (ref 6–20)
CO2: 21 mmol/L — ABNORMAL LOW (ref 22–32)
Calcium: 8.2 mg/dL — ABNORMAL LOW (ref 8.9–10.3)
Chloride: 103 mmol/L (ref 98–111)
Creatinine, Ser: 0.9 mg/dL (ref 0.44–1.00)
GFR, Estimated: >60 mL/min (ref >60)
Glucose, Bld: 121 mg/dL — ABNORMAL HIGH (ref 70–99)
Potassium: 3.9 mmol/L (ref 3.5–5.1)
Sodium: 135 mmol/L (ref 135–145)

## 2022-06-29 LAB — TRIGLYCERIDES: Triglycerides: 55 mg/dL (ref <150)

## 2022-06-29 LAB — CALCIUM, IONIZED: Calcium, Ionized, Serum: 4.8 mg/dL (ref 4.5–5.6)

## 2022-06-29 LAB — HIV ANTIBODY (ROUTINE TESTING W REFLEX): HIV Screen 4th Generation wRfx: NONREACTIVE

## 2022-06-29 MED ORDER — MENTHOL 3 MG MT LOZG: 1.0000 | LOZENGE | OROMUCOSAL | Status: DC | PRN

## 2022-06-29 MED ORDER — ACETAMINOPHEN 10 MG/ML IV SOLN
1000.0000 mg | Freq: Four times a day (QID) | INTRAVENOUS | Status: AC
  Administered 2022-06-29 – 2022-06-30 (×4): 1000 mg via INTRAVENOUS
  Filled 2022-06-29 (×4): qty 100

## 2022-06-29 MED ORDER — KETOROLAC TROMETHAMINE 15 MG/ML IJ SOLN
30.0000 mg | Freq: Four times a day (QID) | INTRAMUSCULAR | Status: AC
  Administered 2022-06-29 – 2022-07-04 (×20): 30 mg via INTRAVENOUS
  Filled 2022-06-29 (×20): qty 2

## 2022-06-29 MED ORDER — ORAL CARE MOUTH RINSE: 15.0000 mL | OROMUCOSAL | Status: DC | PRN

## 2022-06-29 MED ORDER — MORPHINE SULFATE (PF) 2 MG/ML IV SOLN
2.0000 mg | INTRAVENOUS | Status: DC | PRN
  Administered 2022-06-29: 2 mg via INTRAVENOUS
  Administered 2022-06-29: 4 mg via INTRAVENOUS
  Administered 2022-06-29 (×2): 2 mg via INTRAVENOUS
  Administered 2022-06-30: 4 mg via INTRAVENOUS
  Administered 2022-06-30: 2 mg via INTRAVENOUS
  Administered 2022-06-30: 4 mg via INTRAVENOUS
  Administered 2022-06-30: 2 mg via INTRAVENOUS
  Administered 2022-07-01 – 2022-07-02 (×6): 4 mg via INTRAVENOUS
  Administered 2022-07-02 (×2): 2 mg via INTRAVENOUS
  Administered 2022-07-03 (×2): 4 mg via INTRAVENOUS
  Administered 2022-07-03: 2 mg via INTRAVENOUS
  Administered 2022-07-03 (×2): 4 mg via INTRAVENOUS
  Administered 2022-07-04 (×2): 2 mg via INTRAVENOUS
  Administered 2022-07-04: 4 mg via INTRAVENOUS
  Administered 2022-07-04 (×3): 2 mg via INTRAVENOUS
  Administered 2022-07-05 (×6): 4 mg via INTRAVENOUS
  Administered 2022-07-05: 2 mg via INTRAVENOUS
  Administered 2022-07-06 (×3): 4 mg via INTRAVENOUS
  Filled 2022-06-29: qty 2
  Filled 2022-06-29 (×2): qty 1
  Filled 2022-06-29 (×2): qty 2
  Filled 2022-06-29: qty 1
  Filled 2022-06-29: qty 2
  Filled 2022-06-29 (×3): qty 1
  Filled 2022-06-29 (×3): qty 2
  Filled 2022-06-29 (×2): qty 1
  Filled 2022-06-29: qty 2
  Filled 2022-06-29: qty 1
  Filled 2022-06-29: qty 2
  Filled 2022-06-29: qty 1
  Filled 2022-06-29 (×5): qty 2
  Filled 2022-06-29: qty 1
  Filled 2022-06-29 (×4): qty 2
  Filled 2022-06-29: qty 1
  Filled 2022-06-29 (×4): qty 2
  Filled 2022-06-29 (×2): qty 1
  Filled 2022-06-29: qty 2
  Filled 2022-06-29: qty 1

## 2022-06-29 MED ORDER — PHENOL 1.4 % MT LIQD
1.0000 | OROMUCOSAL | Status: DC | PRN
  Administered 2022-06-29: 1 via OROMUCOSAL
  Filled 2022-06-29: qty 177

## 2022-06-29 MED ORDER — METHOCARBAMOL 1000 MG/10ML IJ SOLN
1000.0000 mg | Freq: Three times a day (TID) | INTRAVENOUS | Status: DC
  Administered 2022-06-29 – 2022-07-06 (×21): 1000 mg via INTRAVENOUS
  Filled 2022-06-29: qty 1000
  Filled 2022-06-29: qty 10
  Filled 2022-06-29: qty 1000
  Filled 2022-06-29: qty 10
  Filled 2022-06-29: qty 1000
  Filled 2022-06-29 (×2): qty 10
  Filled 2022-06-29: qty 1000
  Filled 2022-06-29 (×3): qty 10
  Filled 2022-06-29 (×3): qty 1000
  Filled 2022-06-29 (×2): qty 10
  Filled 2022-06-29 (×2): qty 1000
  Filled 2022-06-29 (×2): qty 10
  Filled 2022-06-29: qty 1000
  Filled 2022-06-29: qty 10
  Filled 2022-06-29 (×2): qty 1000
  Filled 2022-06-29: qty 10

## 2022-06-29 NOTE — Progress Notes (Signed)
   06/29/22 1445  Spiritual Encounters  Type of Visit Follow up  Care provided to: Patient  Referral source Chaplain assessment  Reason for visit Routine spiritual support  OnCall Visit No   Chaplain Labrea Eccleston followed up with patient as I was the chaplain on call in the ED 06/27/2024. Just let patient know chaplain was checking on her. Chaplain Billy Turvey let patient know how to contact a chaplain is she had any further needs.   Note Prepared by Abbott Pao, Chaplain Resident [phone]

## 2022-06-29 NOTE — Procedures (Signed)
Extubation Procedure Note  Patient Details:   Name: Judith [name] DOB: 08/17/2000 MRN: [id number]   Airway Documentation:    Vent end date: 06/29/22 Vent end time: 1048   Evaluation  O2 sats: stable throughout Complications: No apparent complications Patient did tolerate procedure well. Bilateral Breath Sounds: Clear, Diminished   Yes  RT extubated patient to 4L [place] per MD order with RN at bedside. Positive cuff leak noted. NIF -25. Patient tolerated well, no stridor or distress noted at this time. RT will continue to monitor as needed.   Fabiola Backer 06/29/2022, 10:49 AM

## 2022-06-29 NOTE — Progress Notes (Signed)
NIF -25, proceed with extubation  Jesusita Oka, MD General and [facility]
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
## 2022-06-30 ENCOUNTER — Inpatient Hospital Stay (HOSPITAL_COMMUNITY): Payer: Medicaid Other

## 2022-06-30 LAB — BASIC METABOLIC PANEL
Anion gap: 13 (ref 5–15)
BUN: 7 mg/dL (ref 6–20)
CO2: 20 mmol/L — ABNORMAL LOW (ref 22–32)
Calcium: 7.8 mg/dL — ABNORMAL LOW (ref 8.9–10.3)
Chloride: 99 mmol/L (ref 98–111)
Creatinine, Ser: 0.82 mg/dL (ref 0.44–1.00)
GFR, Estimated: >60 mL/min (ref >60)
Glucose, Bld: 93 mg/dL (ref 70–99)
Potassium: 3.2 mmol/L — ABNORMAL LOW (ref 3.5–5.1)
Sodium: 132 mmol/L — ABNORMAL LOW (ref 135–145)

## 2022-06-30 LAB — CBC
HCT: 32.6 % — ABNORMAL LOW (ref 36.0–46.0)
Hemoglobin: 10.8 g/dL — ABNORMAL LOW (ref 12.0–15.0)
MCH: 28.1 pg (ref 26.0–34.0)
MCHC: 33.1 g/dL (ref 30.0–36.0)
MCV: 84.7 fL (ref 80.0–100.0)
Platelets: 226 10*3/uL (ref 150–400)
RBC: 3.85 MIL/uL — ABNORMAL LOW (ref 3.87–5.11)
RDW: 17.6 % — ABNORMAL HIGH (ref 11.5–15.5)
WBC: 13.9 10*3/uL — ABNORMAL HIGH (ref 4.0–10.5)
nRBC: 0 % (ref 0.0–0.2)

## 2022-06-30 LAB — SURGICAL PATHOLOGY, GROSS ONLY (NOT ARMC)

## 2022-06-30 MED ORDER — POTASSIUM CHLORIDE 10 MEQ/100ML IV SOLN
10.0000 meq | INTRAVENOUS | Status: AC
  Administered 2022-06-30 (×4): 10 meq via INTRAVENOUS
  Filled 2022-06-30 (×4): qty 100
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
## 2022-07-01 LAB — CBC
HCT: 32.1 % — ABNORMAL LOW (ref 36.0–46.0)
HCT: 32.8 % — ABNORMAL LOW (ref 36.0–46.0)
Hemoglobin: 10.4 g/dL — ABNORMAL LOW (ref 12.0–15.0)
Hemoglobin: 10.8 g/dL — ABNORMAL LOW (ref 12.0–15.0)
MCH: 27.7 pg (ref 26.0–34.0)
MCH: 28.3 pg (ref 26.0–34.0)
MCHC: 31.7 g/dL (ref 30.0–36.0)
MCHC: 33.6 g/dL (ref 30.0–36.0)
MCV: 84.3 fL (ref 80.0–100.0)
MCV: 87.5 fL (ref 80.0–100.0)
Platelets: 240 10*3/uL (ref 150–400)
Platelets: 292 10*3/uL (ref 150–400)
RBC: 3.75 MIL/uL — ABNORMAL LOW (ref 3.87–5.11)
RBC: 3.81 MIL/uL — ABNORMAL LOW (ref 3.87–5.11)
RDW: 17.2 % — ABNORMAL HIGH (ref 11.5–15.5)
RDW: 17.5 % — ABNORMAL HIGH (ref 11.5–15.5)
WBC: 15 10*3/uL — ABNORMAL HIGH (ref 4.0–10.5)
WBC: 17.8 10*3/uL — ABNORMAL HIGH (ref 4.0–10.5)
nRBC: 0 % (ref 0.0–0.2)
nRBC: 0 % (ref 0.0–0.2)

## 2022-07-01 LAB — BASIC METABOLIC PANEL
Anion gap: 15 (ref 5–15)
BUN: 5 mg/dL — ABNORMAL LOW (ref 6–20)
CO2: 18 mmol/L — ABNORMAL LOW (ref 22–32)
Calcium: 8.1 mg/dL — ABNORMAL LOW (ref 8.9–10.3)
Chloride: 103 mmol/L (ref 98–111)
Creatinine, Ser: 0.78 mg/dL (ref 0.44–1.00)
GFR, Estimated: >60 mL/min (ref >60)
Glucose, Bld: 70 mg/dL (ref 70–99)
Potassium: 3.5 mmol/L (ref 3.5–5.1)
Sodium: 136 mmol/L (ref 135–145)

## 2022-07-01 MED ORDER — LIDOCAINE 5 % EX PTCH
1.0000 | MEDICATED_PATCH | CUTANEOUS | Status: DC
  Administered 2022-07-01 – 2022-07-03 (×3): 1 via TRANSDERMAL
  Filled 2022-07-01 (×8): qty 1

## 2022-07-01 MED ORDER — LACTATED RINGERS IV SOLN: INTRAVENOUS | Status: DC
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
## 2022-07-01 NOTE — Plan of Care (Signed)
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
## 2022-07-01 NOTE — Progress Notes (Signed)
Patient retaining urine, bladder scan volume 690. Notified Dr. Donne Hazel, indwelling foley placed at this time.
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
## 2022-07-01 NOTE — Plan of Care (Signed)

## 2022-07-02 ENCOUNTER — Inpatient Hospital Stay (HOSPITAL_COMMUNITY): Payer: Medicaid Other

## 2022-07-02 LAB — TYPE AND SCREEN
ABO/RH(D): O POS
Antibody Screen: NEGATIVE
Unit division: 0
Unit division: 0
Unit division: 0
Unit division: 0
Unit division: 0
Unit division: 0

## 2022-07-02 LAB — BPAM RBC
Blood Product Expiration Date: 202403252359
Blood Product Expiration Date: 202403272359
Blood Product Expiration Date: 202403272359
Blood Product Expiration Date: 202403272359
Blood Product Expiration Date: 202403272359
Blood Product Expiration Date: 202403272359
ISSUE DATE / TIME: 202402250403
ISSUE DATE / TIME: 202402250403
Unit Type and Rh: 5100
Unit Type and Rh: 5100
Unit Type and Rh: 5100
Unit Type and Rh: 5100
Unit Type and Rh: 5100
Unit Type and Rh: 5100

## 2022-07-02 LAB — BASIC METABOLIC PANEL
Anion gap: 12 (ref 5–15)
BUN: 5 mg/dL — ABNORMAL LOW (ref 6–20)
CO2: 21 mmol/L — ABNORMAL LOW (ref 22–32)
Calcium: 7.9 mg/dL — ABNORMAL LOW (ref 8.9–10.3)
Chloride: 103 mmol/L (ref 98–111)
Creatinine, Ser: 0.79 mg/dL (ref 0.44–1.00)
GFR, Estimated: >60 mL/min (ref >60)
Glucose, Bld: 81 mg/dL (ref 70–99)
Potassium: 3.2 mmol/L — ABNORMAL LOW (ref 3.5–5.1)
Sodium: 136 mmol/L (ref 135–145)

## 2022-07-02 LAB — CBC
HCT: 29.6 % — ABNORMAL LOW (ref 36.0–46.0)
Hemoglobin: 10.1 g/dL — ABNORMAL LOW (ref 12.0–15.0)
MCH: 28.6 pg (ref 26.0–34.0)
MCHC: 34.1 g/dL (ref 30.0–36.0)
MCV: 83.9 fL (ref 80.0–100.0)
Platelets: 273 10*3/uL (ref 150–400)
RBC: 3.53 MIL/uL — ABNORMAL LOW (ref 3.87–5.11)
RDW: 17.2 % — ABNORMAL HIGH (ref 11.5–15.5)
WBC: 15.7 10*3/uL — ABNORMAL HIGH (ref 4.0–10.5)
nRBC: 0 % (ref 0.0–0.2)

## 2022-07-02 MED ORDER — PANTOPRAZOLE SODIUM 40 MG IV SOLR
40.0000 mg | INTRAVENOUS | Status: DC
  Administered 2022-07-02 – 2022-07-10 (×9): 40 mg via INTRAVENOUS
  Filled 2022-07-02 (×9): qty 10
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
## 2022-07-03 ENCOUNTER — Inpatient Hospital Stay (HOSPITAL_COMMUNITY): Payer: Medicaid Other

## 2022-07-03 LAB — BASIC METABOLIC PANEL
Anion gap: 8 (ref 5–15)
BUN: <5 mg/dL — ABNORMAL LOW (ref 6–20)
CO2: 24 mmol/L (ref 22–32)
Calcium: 7.9 mg/dL — ABNORMAL LOW (ref 8.9–10.3)
Chloride: 103 mmol/L (ref 98–111)
Creatinine, Ser: 0.7 mg/dL (ref 0.44–1.00)
GFR, Estimated: >60 mL/min (ref >60)
Glucose, Bld: 99 mg/dL (ref 70–99)
Potassium: 2.9 mmol/L — ABNORMAL LOW (ref 3.5–5.1)
Sodium: 135 mmol/L (ref 135–145)

## 2022-07-03 LAB — CBC
HCT: 29.5 % — ABNORMAL LOW (ref 36.0–46.0)
Hemoglobin: 9.9 g/dL — ABNORMAL LOW (ref 12.0–15.0)
MCH: 28 pg (ref 26.0–34.0)
MCHC: 33.6 g/dL (ref 30.0–36.0)
MCV: 83.3 fL (ref 80.0–100.0)
Platelets: 323 10*3/uL (ref 150–400)
RBC: 3.54 MIL/uL — ABNORMAL LOW (ref 3.87–5.11)
RDW: 17.2 % — ABNORMAL HIGH (ref 11.5–15.5)
WBC: 14.5 10*3/uL — ABNORMAL HIGH (ref 4.0–10.5)
nRBC: 0 % (ref 0.0–0.2)

## 2022-07-03 LAB — MAGNESIUM: Magnesium: 1.6 mg/dL — ABNORMAL LOW (ref 1.7–2.4)

## 2022-07-03 MED ORDER — POTASSIUM CHLORIDE 10 MEQ/100ML IV SOLN
10.0000 meq | INTRAVENOUS | Status: AC
  Administered 2022-07-03 (×5): 10 meq via INTRAVENOUS
  Filled 2022-07-03 (×6): qty 100

## 2022-07-03 MED ORDER — POTASSIUM CHLORIDE 10 MEQ/100ML IV SOLN: 10.0000 meq | INTRAVENOUS | Status: DC

## 2022-07-03 MED ORDER — IOHEXOL 9 MG/ML PO SOLN: 500.0000 mL | ORAL | Status: AC

## 2022-07-03 MED ORDER — PIPERACILLIN-TAZOBACTAM 3.375 G IVPB
3.3750 g | Freq: Three times a day (TID) | INTRAVENOUS | Status: DC
  Administered 2022-07-03 – 2022-07-10 (×21): 3.375 g via INTRAVENOUS
  Filled 2022-07-03 (×21): qty 50

## 2022-07-03 MED ORDER — IOHEXOL 350 MG/ML SOLN
75.0000 mL | Freq: Once | INTRAVENOUS | Status: AC | PRN
  Administered 2022-07-03: 75 mL via INTRAVENOUS

## 2022-07-03 MED ORDER — PIPERACILLIN-TAZOBACTAM 3.375 G IVPB 30 MIN: 3.3750 g | Freq: Three times a day (TID) | INTRAVENOUS | Status: DC
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
## 2022-07-03 NOTE — Progress Notes (Signed)
PT Cancellation Note  Patient Details Name: Judith [name] MRN: [id number] DOB: 2000-09-08   Cancelled Treatment:    Reason Eval/Treat Not Completed: Other (comment)  Spoke with RN, pt prepping for transport to CT. Will attempt follow up this afternoon as schedule permits.  Ellouise Newer 07/03/2022, 12:22 PM

## 2022-07-03 NOTE — Progress Notes (Addendum)
Spoke with provider, contrast order in [facility] would not allow administration due to oral route instead of NG route. Provider said to give. Gave 250 cc contrast at 12:30, another 200 cc at 16:30. Request to change order was sent to pharmacy. Contrast was given so far apart because new IV access was unable to be obtained. PA approved old IV to be used. CT notified.

## 2022-07-03 NOTE — Progress Notes (Signed)
Pt has been assessed w/ ultrasound and attempted by 2 IV team RNs. No suitable veins for IV access for CT scan at this time. L arm swollen and painful, assessed with ultrasound, veins not suitable for 20g at or above AC. Pt with working 18g in R Charna Archer, MD to determine if usable for CT scan. Spoke with floor RN, agreeable to plan.

## 2022-07-03 NOTE — Progress Notes (Signed)
Piv attempt to left ac  x1, unsuccessful. Iv team consulted.

## 2022-07-03 NOTE — Progress Notes (Signed)
Unable to get new IV started in LUE, IV team attempt x2. Patient has a working IV in right [facility]. Acceptable to use this for contrast injection. Attending team will accept responsibility of management if IV infiltrates.   Norm Parcel, [facility] Surgery 07/03/2022, 4:05 PM Please see Amion for pager number during day hours 7:00am-4:30pm

## 2022-07-04 ENCOUNTER — Inpatient Hospital Stay (HOSPITAL_COMMUNITY): Payer: Medicaid Other

## 2022-07-04 LAB — CBC
HCT: 29.4 % — ABNORMAL LOW (ref 36.0–46.0)
Hemoglobin: 9.9 g/dL — ABNORMAL LOW (ref 12.0–15.0)
MCH: 28.2 pg (ref 26.0–34.0)
MCHC: 33.7 g/dL (ref 30.0–36.0)
MCV: 83.8 fL (ref 80.0–100.0)
Platelets: 367 10*3/uL (ref 150–400)
RBC: 3.51 MIL/uL — ABNORMAL LOW (ref 3.87–5.11)
RDW: 17.4 % — ABNORMAL HIGH (ref 11.5–15.5)
WBC: 19.7 10*3/uL — ABNORMAL HIGH (ref 4.0–10.5)
nRBC: 0 % (ref 0.0–0.2)

## 2022-07-04 LAB — BASIC METABOLIC PANEL
Anion gap: 10 (ref 5–15)
BUN: <5 mg/dL — ABNORMAL LOW (ref 6–20)
CO2: 22 mmol/L (ref 22–32)
Calcium: 7.9 mg/dL — ABNORMAL LOW (ref 8.9–10.3)
Chloride: 101 mmol/L (ref 98–111)
Creatinine, Ser: 0.71 mg/dL (ref 0.44–1.00)
GFR, Estimated: >60 mL/min (ref >60)
Glucose, Bld: 91 mg/dL (ref 70–99)
Potassium: 3.2 mmol/L — ABNORMAL LOW (ref 3.5–5.1)
Sodium: 133 mmol/L — ABNORMAL LOW (ref 135–145)

## 2022-07-04 MED ORDER — LIDOCAINE HCL (PF) 1 % IJ SOLN
INTRAMUSCULAR | Status: AC
  Filled 2022-07-04: qty 30

## 2022-07-04 MED ORDER — ACETAMINOPHEN 10 MG/ML IV SOLN
1000.0000 mg | INTRAVENOUS | Status: AC
  Administered 2022-07-04: 1000 mg via INTRAVENOUS
  Filled 2022-07-04: qty 100

## 2022-07-04 MED ORDER — KCL IN DEXTROSE-NACL 40-5-0.45 MEQ/L-%-% IV SOLN
INTRAVENOUS | Status: DC
  Filled 2022-07-04 (×12): qty 1000

## 2022-07-04 MED ORDER — POTASSIUM CHLORIDE 10 MEQ/100ML IV SOLN
10.0000 meq | INTRAVENOUS | Status: AC
  Administered 2022-07-04 (×3): 10 meq via INTRAVENOUS
  Filled 2022-07-04 (×3): qty 100
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
## 2022-07-04 NOTE — Progress Notes (Signed)
PT Cancellation Note  Patient Details Name: Judith [name] MRN: [id number] DOB: 01/13/2001   Cancelled Treatment:    Reason Eval/Treat Not Completed: (P) Pain limiting ability to participate (Informed RN of need for pain meds and will return in 30 min.)   [name] [name] 07/04/2022, 3:47 PM  Erasmo Leventhal , PTA [place] Office [phone]

## 2022-07-04 NOTE — Progress Notes (Signed)
Patient tolerating clamped NG tube all day with ice chips and jello. No complaints of nausea or vomiting. NG removed. Patient tolerating well
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
## 2022-07-04 NOTE — Progress Notes (Signed)
Patient with new fever. Orders received for iv tylenol   07/04/22 1619  Assess: MEWS Score  Temp (!) 102 F (38.9 C)  BP (!) 155/97  MAP (mmHg) 116  Pulse Rate 100  Resp 19  Level of Consciousness Alert  SpO2 98 %  O2 Device Nasal Cannula  Assess: MEWS Score  MEWS Temp 2  MEWS Systolic 0  MEWS Pulse 0  MEWS RR 0  MEWS LOC 0  MEWS Score 2  MEWS Score Color Yellow  Assess: if the MEWS score is Yellow or Red  Were vital signs taken at a resting state? Yes  Focused Assessment No change from prior assessment  Does the patient meet 2 or more of the SIRS criteria? Yes  Does the patient have a confirmed or suspected source of infection? No  MEWS guidelines implemented  Yes, yellow  Treat  MEWS Interventions Considered administering scheduled or prn medications/treatments as ordered  Take Vital Signs  Increase Vital Sign Frequency  Yellow: Q2hr x1, continue Q4hrs until patient remains green for 12hrs  Escalate  MEWS: Escalate Yellow: Discuss with charge nurse and consider notifying provider and/or RRT  Notify: Charge Nurse/RN  Name of Charge Nurse/RN Optometrist  Provider Notification  Provider Name/Title t  Date Provider Notified 07/04/22  Time Provider Notified 1620  Method of Notification Page  Notification Reason Change in status  Provider response See new orders  Date of Provider Response 07/04/22  Time of Provider Response 1621  Assess: SIRS CRITERIA  SIRS Temperature  1  SIRS Pulse 1  SIRS Respirations  0  SIRS WBC 1  SIRS Score Sum  3

## 2022-07-05 LAB — GLUCOSE, CAPILLARY: Glucose-Capillary: 145 mg/dL — ABNORMAL HIGH (ref 70–99)

## 2022-07-05 LAB — CBC
HCT: 28.8 % — ABNORMAL LOW (ref 36.0–46.0)
Hemoglobin: 9.5 g/dL — ABNORMAL LOW (ref 12.0–15.0)
MCH: 27.7 pg (ref 26.0–34.0)
MCHC: 33 g/dL (ref 30.0–36.0)
MCV: 84 fL (ref 80.0–100.0)
Platelets: 475 10*3/uL — ABNORMAL HIGH (ref 150–400)
RBC: 3.43 MIL/uL — ABNORMAL LOW (ref 3.87–5.11)
RDW: 17.2 % — ABNORMAL HIGH (ref 11.5–15.5)
WBC: 22.5 10*3/uL — ABNORMAL HIGH (ref 4.0–10.5)
nRBC: 0 % (ref 0.0–0.2)

## 2022-07-05 LAB — BASIC METABOLIC PANEL
Anion gap: 7 (ref 5–15)
BUN: <5 mg/dL — ABNORMAL LOW (ref 6–20)
CO2: 23 mmol/L (ref 22–32)
Calcium: 7.8 mg/dL — ABNORMAL LOW (ref 8.9–10.3)
Chloride: 104 mmol/L (ref 98–111)
Creatinine, Ser: 0.61 mg/dL (ref 0.44–1.00)
GFR, Estimated: >60 mL/min (ref >60)
Glucose, Bld: 186 mg/dL — ABNORMAL HIGH (ref 70–99)
Potassium: 4 mmol/L (ref 3.5–5.1)
Sodium: 134 mmol/L — ABNORMAL LOW (ref 135–145)
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
## 2022-07-06 ENCOUNTER — Ambulatory Visit: Payer: Medicaid Other | Admitting: Dermatology

## 2022-07-06 ENCOUNTER — Inpatient Hospital Stay (HOSPITAL_COMMUNITY): Payer: Medicaid Other

## 2022-07-06 LAB — BASIC METABOLIC PANEL
Anion gap: 9 (ref 5–15)
BUN: <5 mg/dL — ABNORMAL LOW (ref 6–20)
CO2: 22 mmol/L (ref 22–32)
Calcium: 8 mg/dL — ABNORMAL LOW (ref 8.9–10.3)
Chloride: 102 mmol/L (ref 98–111)
Creatinine, Ser: 0.57 mg/dL (ref 0.44–1.00)
GFR, Estimated: >60 mL/min (ref >60)
Glucose, Bld: 146 mg/dL — ABNORMAL HIGH (ref 70–99)
Potassium: 4.4 mmol/L (ref 3.5–5.1)
Sodium: 133 mmol/L — ABNORMAL LOW (ref 135–145)

## 2022-07-06 LAB — CBC
HCT: 29.6 % — ABNORMAL LOW (ref 36.0–46.0)
Hemoglobin: 9.9 g/dL — ABNORMAL LOW (ref 12.0–15.0)
MCH: 28.3 pg (ref 26.0–34.0)
MCHC: 33.4 g/dL (ref 30.0–36.0)
MCV: 84.6 fL (ref 80.0–100.0)
Platelets: 570 10*3/uL — ABNORMAL HIGH (ref 150–400)
RBC: 3.5 MIL/uL — ABNORMAL LOW (ref 3.87–5.11)
RDW: 17.6 % — ABNORMAL HIGH (ref 11.5–15.5)
WBC: 24.2 10*3/uL — ABNORMAL HIGH (ref 4.0–10.5)
nRBC: 0 % (ref 0.0–0.2)

## 2022-07-06 MED ORDER — MIDAZOLAM HCL 2 MG/2ML IJ SOLN
INTRAMUSCULAR | Status: AC | PRN
  Administered 2022-07-06 (×2): 1 mg via INTRAVENOUS

## 2022-07-06 MED ORDER — METHOCARBAMOL 500 MG PO TABS
1000.0000 mg | ORAL_TABLET | Freq: Three times a day (TID) | ORAL | Status: DC
  Administered 2022-07-06 – 2022-07-07 (×3): 1000 mg via ORAL
  Filled 2022-07-06 (×6): qty 2

## 2022-07-06 MED ORDER — ENOXAPARIN SODIUM 30 MG/0.3ML IJ SOSY
30.0000 mg | PREFILLED_SYRINGE | Freq: Two times a day (BID) | INTRAMUSCULAR | Status: DC
  Administered 2022-07-07 – 2022-07-20 (×26): 30 mg via SUBCUTANEOUS
  Filled 2022-07-06 (×25): qty 0.3

## 2022-07-06 MED ORDER — DOCUSATE SODIUM 100 MG PO CAPS
100.0000 mg | ORAL_CAPSULE | Freq: Two times a day (BID) | ORAL | Status: DC
  Administered 2022-07-06 – 2022-07-08 (×4): 100 mg via ORAL
  Filled 2022-07-06 (×13): qty 1

## 2022-07-06 MED ORDER — OXYCODONE HCL 5 MG PO TABS
5.0000 mg | ORAL_TABLET | ORAL | Status: DC | PRN
  Administered 2022-07-06 – 2022-07-14 (×7): 10 mg via ORAL
  Administered 2022-07-15: 5 mg via ORAL
  Administered 2022-07-15 – 2022-07-19 (×9): 10 mg via ORAL
  Filled 2022-07-06 (×14): qty 2
  Filled 2022-07-06: qty 1
  Filled 2022-07-06 (×2): qty 2

## 2022-07-06 MED ORDER — FENTANYL CITRATE (PF) 100 MCG/2ML IJ SOLN
INTRAMUSCULAR | Status: AC
  Filled 2022-07-06: qty 2

## 2022-07-06 MED ORDER — LIDOCAINE-EPINEPHRINE 1 %-1:100000 IJ SOLN
10.0000 mL | Freq: Once | INTRAMUSCULAR | Status: AC
  Administered 2022-07-06: 10 mL

## 2022-07-06 MED ORDER — MORPHINE SULFATE (PF) 2 MG/ML IV SOLN
2.0000 mg | INTRAVENOUS | Status: DC | PRN
  Administered 2022-07-06 – 2022-07-11 (×18): 2 mg via INTRAVENOUS
  Filled 2022-07-06 (×20): qty 1

## 2022-07-06 MED ORDER — MIDAZOLAM HCL 2 MG/2ML IJ SOLN
INTRAMUSCULAR | Status: AC
  Filled 2022-07-06: qty 2

## 2022-07-06 MED ORDER — ACETAMINOPHEN 325 MG PO TABS: 650.0000 mg | ORAL_TABLET | Freq: Four times a day (QID) | ORAL | Status: DC | PRN

## 2022-07-06 MED ORDER — POLYETHYLENE GLYCOL 3350 17 G PO PACK
17.0000 g | PACK | Freq: Every day | ORAL | Status: DC
  Administered 2022-07-06: 17 g via ORAL
  Filled 2022-07-06 (×2): qty 1

## 2022-07-06 MED ORDER — FENTANYL CITRATE (PF) 100 MCG/2ML IJ SOLN
INTRAMUSCULAR | Status: AC | PRN
  Administered 2022-07-06: 50 ug via INTRAVENOUS
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
## 2022-07-06 NOTE — Progress Notes (Signed)
Mobility Specialist Progress Note    07/06/22 1649  Mobility  Activity Contraindicated/medical hold   Pt w/ pain from new CT and temperature. Will f/u as schedule permits.   Hildred Alamin Mobility Specialist  Please Psychologist, sport and exercise or Rehab Office at [phone]
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
## 2022-07-06 NOTE — Sedation Documentation (Signed)
87m of amber colored fluid removed from the left side chest. Sample is being sent to lab.
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
## 2022-07-06 NOTE — Procedures (Signed)
Pre procedural Dx: Residual left sided pleural effusion Post procedural Dx: Same  Technically successful [place] and CT guided placed of a 14 Fr drainage catheter placement into the residual left sided pleural effusion yielding 75 cc of slightly blood tinged pleural fluid.   A representative aspirated sample was capped and sent to the laboratory for analysis.    Chest tube connected to Pleur-Vac device.   EBL: Trace Complications: None immediate  Ronny Bacon, MD Pager #: [phone]

## 2022-07-06 NOTE — Progress Notes (Signed)
OT Cancellation Note  Patient Details Name: Judith [name] MRN: [id number] DOB: 27-Sep-2000   Cancelled Treatment:    Reason Eval/Treat Not Completed: Patient at procedure or test/ unavailable  Britt Bottom 07/06/2022, 2:00 PM

## 2022-07-07 ENCOUNTER — Inpatient Hospital Stay (HOSPITAL_COMMUNITY): Payer: Medicaid Other

## 2022-07-07 LAB — BASIC METABOLIC PANEL
Anion gap: 9 (ref 5–15)
BUN: <5 mg/dL — ABNORMAL LOW (ref 6–20)
CO2: 23 mmol/L (ref 22–32)
Calcium: 8.3 mg/dL — ABNORMAL LOW (ref 8.9–10.3)
Chloride: 102 mmol/L (ref 98–111)
Creatinine, Ser: 0.72 mg/dL (ref 0.44–1.00)
GFR, Estimated: >60 mL/min (ref >60)
Glucose, Bld: 95 mg/dL (ref 70–99)
Potassium: 4.3 mmol/L (ref 3.5–5.1)
Sodium: 134 mmol/L — ABNORMAL LOW (ref 135–145)

## 2022-07-07 LAB — CBC
HCT: 29.1 % — ABNORMAL LOW (ref 36.0–46.0)
Hemoglobin: 9.9 g/dL — ABNORMAL LOW (ref 12.0–15.0)
MCH: 28.4 pg (ref 26.0–34.0)
MCHC: 34 g/dL (ref 30.0–36.0)
MCV: 83.4 fL (ref 80.0–100.0)
Platelets: 606 10*3/uL — ABNORMAL HIGH (ref 150–400)
RBC: 3.49 MIL/uL — ABNORMAL LOW (ref 3.87–5.11)
RDW: 17.4 % — ABNORMAL HIGH (ref 11.5–15.5)
WBC: 19.5 10*3/uL — ABNORMAL HIGH (ref 4.0–10.5)
nRBC: 0 % (ref 0.0–0.2)

## 2022-07-07 MED ORDER — ENSURE ENLIVE PO LIQD
237.0000 mL | Freq: Two times a day (BID) | ORAL | Status: DC
  Administered 2022-07-08 – 2022-07-20 (×6): 237 mL via ORAL

## 2022-07-07 MED ORDER — ACETAMINOPHEN 500 MG PO TABS
1000.0000 mg | ORAL_TABLET | Freq: Four times a day (QID) | ORAL | Status: DC
  Administered 2022-07-07 – 2022-07-13 (×2): 1000 mg via ORAL
  Administered 2022-07-13: 500 mg via ORAL
  Filled 2022-07-07 (×17): qty 2
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
## 2022-07-07 NOTE — Progress Notes (Signed)
Patient  had x1 vomit of her po meds,does not want dressing change ,patient educated, on call informed,will continue to monitor.
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
## 2022-07-08 ENCOUNTER — Encounter: Payer: Medicaid Other | Admitting: Student

## 2022-07-08 ENCOUNTER — Inpatient Hospital Stay (HOSPITAL_COMMUNITY): Payer: Medicaid Other

## 2022-07-08 LAB — CBC
HCT: 29.5 % — ABNORMAL LOW (ref 36.0–46.0)
Hemoglobin: 9.8 g/dL — ABNORMAL LOW (ref 12.0–15.0)
MCH: 27.7 pg (ref 26.0–34.0)
MCHC: 33.2 g/dL (ref 30.0–36.0)
MCV: 83.3 fL (ref 80.0–100.0)
Platelets: 799 10*3/uL — ABNORMAL HIGH (ref 150–400)
RBC: 3.54 MIL/uL — ABNORMAL LOW (ref 3.87–5.11)
RDW: 17.2 % — ABNORMAL HIGH (ref 11.5–15.5)
WBC: 17.9 10*3/uL — ABNORMAL HIGH (ref 4.0–10.5)
nRBC: 0 % (ref 0.0–0.2)

## 2022-07-08 MED ORDER — BISACODYL 10 MG RE SUPP
10.0000 mg | Freq: Once | RECTAL | Status: DC
  Filled 2022-07-08: qty 1

## 2022-07-08 MED ORDER — ALUM & MAG HYDROXIDE-SIMETH 200-200-20 MG/5ML PO SUSP: 15.0000 mL | ORAL | Status: DC | PRN

## 2022-07-08 MED ORDER — SENNA 8.6 MG PO TABS
1.0000 | ORAL_TABLET | Freq: Every day | ORAL | Status: DC
  Filled 2022-07-08 (×3): qty 1

## 2022-07-08 MED ORDER — BISACODYL 10 MG RE SUPP: 10.0000 mg | Freq: Every day | RECTAL | Status: DC | PRN

## 2022-07-08 MED ORDER — POLYETHYLENE GLYCOL 3350 17 G PO PACK
17.0000 g | PACK | Freq: Two times a day (BID) | ORAL | Status: DC
  Administered 2022-07-08 – 2022-07-18 (×7): 17 g via ORAL
  Filled 2022-07-08 (×13): qty 1

## 2022-07-08 MED ORDER — METHOCARBAMOL 1000 MG/10ML IJ SOLN
1000.0000 mg | Freq: Three times a day (TID) | INTRAVENOUS | Status: DC
  Administered 2022-07-08 – 2022-07-12 (×15): 1000 mg via INTRAVENOUS
  Filled 2022-07-08 (×2): qty 10
  Filled 2022-07-08: qty 1000
  Filled 2022-07-08 (×5): qty 10
  Filled 2022-07-08: qty 1000
  Filled 2022-07-08: qty 10
  Filled 2022-07-08: qty 1000
  Filled 2022-07-08 (×2): qty 10
  Filled 2022-07-08: qty 1000
  Filled 2022-07-08: qty 10
  Filled 2022-07-08 (×3): qty 1000

## 2022-07-08 MED ORDER — TAMSULOSIN HCL 0.4 MG PO CAPS
0.4000 mg | ORAL_CAPSULE | Freq: Every day | ORAL | Status: DC
  Administered 2022-07-08 – 2022-07-20 (×7): 0.4 mg via ORAL
  Filled 2022-07-08 (×9): qty 1
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
## 2022-07-09 ENCOUNTER — Ambulatory Visit: Payer: Medicaid Other | Admitting: Student

## 2022-07-09 ENCOUNTER — Inpatient Hospital Stay (HOSPITAL_COMMUNITY): Payer: Medicaid Other

## 2022-07-09 DIAGNOSIS — Z91199 Patient's noncompliance with other medical treatment and regimen due to unspecified reason: Secondary | ICD-10-CM

## 2022-07-09 DIAGNOSIS — N62 Hypertrophy of breast: Secondary | ICD-10-CM

## 2022-07-09 LAB — CBC
HCT: 29.3 % — ABNORMAL LOW (ref 36.0–46.0)
Hemoglobin: 9.9 g/dL — ABNORMAL LOW (ref 12.0–15.0)
MCH: 27.6 pg (ref 26.0–34.0)
MCHC: 33.8 g/dL (ref 30.0–36.0)
MCV: 81.6 fL (ref 80.0–100.0)
Platelets: 905 10*3/uL (ref 150–400)
RBC: 3.59 MIL/uL — ABNORMAL LOW (ref 3.87–5.11)
RDW: 17.1 % — ABNORMAL HIGH (ref 11.5–15.5)
WBC: 13 10*3/uL — ABNORMAL HIGH (ref 4.0–10.5)
nRBC: 0 % (ref 0.0–0.2)

## 2022-07-09 LAB — PATHOLOGIST SMEAR REVIEW: Path Review: REACTIVE

## 2022-07-09 MED ORDER — MENINGOCOCCAL VAC B (OMV) IM SUSY
0.5000 mL | PREFILLED_SYRINGE | INTRAMUSCULAR | Status: AC | PRN
  Administered 2022-07-12: 0.5 mL via INTRAMUSCULAR
  Filled 2022-07-09: qty 0.5

## 2022-07-09 MED ORDER — PROSOURCE PLUS PO LIQD
30.0000 mL | Freq: Two times a day (BID) | ORAL | Status: DC
  Administered 2022-07-09 – 2022-07-20 (×2): 30 mL via ORAL
  Filled 2022-07-09 (×6): qty 30

## 2022-07-09 MED ORDER — ONDANSETRON HCL 4 MG/2ML IJ SOLN
4.0000 mg | Freq: Four times a day (QID) | INTRAMUSCULAR | Status: DC
  Administered 2022-07-09 – 2022-07-13 (×14): 4 mg via INTRAVENOUS
  Filled 2022-07-09 (×13): qty 2

## 2022-07-09 MED ORDER — HAEMOPHILUS B POLYSAC CONJ VAC IM SOLR
0.5000 mL | INTRAMUSCULAR | Status: AC | PRN
  Administered 2022-07-12: 0.5 mL via INTRAMUSCULAR
  Filled 2022-07-09: qty 0.5

## 2022-07-09 MED ORDER — PNEUMOCOCCAL 20-VAL CONJ VACC 0.5 ML IM SUSY
0.5000 mL | PREFILLED_SYRINGE | INTRAMUSCULAR | Status: AC | PRN
  Administered 2022-07-13: 0.5 mL via INTRAMUSCULAR
  Filled 2022-07-09: qty 0.5

## 2022-07-09 MED ORDER — MENINGOCOCCAL A C Y&W-135 OLIG IM SOLR
0.5000 mL | INTRAMUSCULAR | Status: AC | PRN
  Administered 2022-07-13: 0.5 mL via INTRAMUSCULAR
  Filled 2022-07-09: qty 0.5
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
## 2022-07-10 ENCOUNTER — Inpatient Hospital Stay (HOSPITAL_COMMUNITY): Payer: Medicaid Other

## 2022-07-10 LAB — CBC
HCT: 31.6 % — ABNORMAL LOW (ref 36.0–46.0)
Hemoglobin: 10.4 g/dL — ABNORMAL LOW (ref 12.0–15.0)
MCH: 27.6 pg (ref 26.0–34.0)
MCHC: 32.9 g/dL (ref 30.0–36.0)
MCV: 83.8 fL (ref 80.0–100.0)
Platelets: 1072 10*3/uL (ref 150–400)
RBC: 3.77 MIL/uL — ABNORMAL LOW (ref 3.87–5.11)
RDW: 17.4 % — ABNORMAL HIGH (ref 11.5–15.5)
WBC: 12.4 10*3/uL — ABNORMAL HIGH (ref 4.0–10.5)
nRBC: 0 % (ref 0.0–0.2)

## 2022-07-10 LAB — BASIC METABOLIC PANEL
Anion gap: 11 (ref 5–15)
BUN: <5 mg/dL — ABNORMAL LOW (ref 6–20)
CO2: 23 mmol/L (ref 22–32)
Calcium: 8.5 mg/dL — ABNORMAL LOW (ref 8.9–10.3)
Chloride: 101 mmol/L (ref 98–111)
Creatinine, Ser: 0.68 mg/dL (ref 0.44–1.00)
GFR, Estimated: >60 mL/min (ref >60)
Glucose, Bld: 129 mg/dL — ABNORMAL HIGH (ref 70–99)
Potassium: 4.2 mmol/L (ref 3.5–5.1)
Sodium: 135 mmol/L (ref 135–145)

## 2022-07-10 MED ORDER — BISACODYL 10 MG RE SUPP
10.0000 mg | Freq: Once | RECTAL | Status: DC
  Filled 2022-07-10: qty 1

## 2022-07-10 NOTE — Progress Notes (Signed)
OT Cancellation Note  Patient Details Name: Judith [name] MRN: [id number] DOB: [birth year]   Cancelled Treatment:    Reason Eval/Treat Not Completed: Pain limiting ability to participate.  Attempted skilled OT session.  Pt. Had finished with PT prior to my arrival.  Provided suggestions of therapeutic options for our session.  Pt. Expressing in multiple ways that she was not wanting to participate at this time.  C/o back pain provided as her main reason.  Offered some repositioning options and tech. Used for pts. With back sx. That are comforting when in bed.  Pt. Declined these attempts.  Will try back as able.    Clearnce Sorrel Lorraine-COTA/L 07/10/2022, 12:06 PM
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
## 2022-07-10 NOTE — Progress Notes (Signed)
Removed pt JP drain, covered with gauze. Pt had medium size bowel movement without complications. Pt has received morphine once today and states she has tolerable pain and doesn't want any pain medication. Pt ate a whole [place] and cheese sandwich without nausea or stomach discomfort.
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
## 2022-07-11 ENCOUNTER — Inpatient Hospital Stay (HOSPITAL_COMMUNITY): Payer: Medicaid Other

## 2022-07-11 LAB — AEROBIC/ANAEROBIC CULTURE W GRAM STAIN (SURGICAL/DEEP WOUND): Culture: NO GROWTH

## 2022-07-11 LAB — CBC
HCT: 33.6 % — ABNORMAL LOW (ref 36.0–46.0)
Hemoglobin: 10.8 g/dL — ABNORMAL LOW (ref 12.0–15.0)
MCH: 27.3 pg (ref 26.0–34.0)
MCHC: 32.1 g/dL (ref 30.0–36.0)
MCV: 84.8 fL (ref 80.0–100.0)
Platelets: 1149 10*3/uL (ref 150–400)
RBC: 3.96 MIL/uL (ref 3.87–5.11)
RDW: 17.5 % — ABNORMAL HIGH (ref 11.5–15.5)
WBC: 11.2 10*3/uL — ABNORMAL HIGH (ref 4.0–10.5)
nRBC: 0 % (ref 0.0–0.2)

## 2022-07-11 MED ORDER — MORPHINE SULFATE (PF) 2 MG/ML IV SOLN
2.0000 mg | INTRAVENOUS | Status: DC | PRN
  Administered 2022-07-12: 2 mg via INTRAVENOUS
  Filled 2022-07-11 (×2): qty 1

## 2022-07-11 MED ORDER — ASPIRIN 325 MG PO TABS
325.0000 mg | ORAL_TABLET | Freq: Every day | ORAL | Status: DC
  Filled 2022-07-11: qty 1

## 2022-07-11 MED ORDER — PANTOPRAZOLE SODIUM 40 MG PO TBEC
40.0000 mg | DELAYED_RELEASE_TABLET | Freq: Every day | ORAL | Status: DC
  Administered 2022-07-11 – 2022-07-19 (×9): 40 mg via ORAL
  Filled 2022-07-11 (×10): qty 1

## 2022-07-11 MED ORDER — DOXYCYCLINE HYCLATE 100 MG PO TABS
100.0000 mg | ORAL_TABLET | Freq: Two times a day (BID) | ORAL | Status: DC
  Administered 2022-07-11 – 2022-07-14 (×7): 100 mg via ORAL
  Filled 2022-07-11 (×7): qty 1

## 2022-07-11 MED ORDER — MUPIROCIN 2 % EX OINT
TOPICAL_OINTMENT | Freq: Three times a day (TID) | CUTANEOUS | Status: DC
  Administered 2022-07-11 – 2022-07-14 (×2): 1 via TOPICAL
  Filled 2022-07-11: qty 22

## 2022-07-11 NOTE — Plan of Care (Signed)
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
## 2022-07-11 NOTE — Progress Notes (Signed)
Mobility Specialist Progress Note    07/11/22 1355  Mobility  Activity Ambulated with assistance in hallway  Level of Assistance Contact guard assist, steadying assist  Assistive Device Other (Comment) (HHA)  Distance Ambulated (ft) 250 ft  Activity Response Tolerated well  Mobility Referral Yes  $Mobility charge 1 Mobility   Pt received coming out of BR and agreeable. C/o some lower back pain. Returned to bed with call bell in reach.   Hildred Alamin Mobility Specialist  Please Psychologist, sport and exercise or Rehab Office at [phone]
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
## 2022-07-12 ENCOUNTER — Inpatient Hospital Stay (HOSPITAL_COMMUNITY): Payer: Medicaid Other

## 2022-07-12 MED ORDER — MORPHINE SULFATE (PF) 2 MG/ML IV SOLN
1.0000 mg | INTRAVENOUS | Status: DC | PRN
  Administered 2022-07-12 – 2022-07-13 (×3): 1 mg via INTRAVENOUS
  Filled 2022-07-12 (×3): qty 1

## 2022-07-12 NOTE — Progress Notes (Signed)
Patient was administered haemophilus B polysac conj vaccine to left deltoid, and meningococcal vac B to right deltoid, tolerated well. Education provided, pt verbalized understanding.
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
## 2022-07-12 NOTE — Plan of Care (Signed)

## 2022-07-13 DIAGNOSIS — Z9889 Other specified postprocedural states: Secondary | ICD-10-CM

## 2022-07-13 LAB — CBC
HCT: 33.2 % — ABNORMAL LOW (ref 36.0–46.0)
Hemoglobin: 10.6 g/dL — ABNORMAL LOW (ref 12.0–15.0)
MCH: 27.3 pg (ref 26.0–34.0)
MCHC: 31.9 g/dL (ref 30.0–36.0)
MCV: 85.6 fL (ref 80.0–100.0)
Platelets: 1201 10*3/uL (ref 150–400)
RBC: 3.88 MIL/uL (ref 3.87–5.11)
RDW: 17.7 % — ABNORMAL HIGH (ref 11.5–15.5)
WBC: 12.9 10*3/uL — ABNORMAL HIGH (ref 4.0–10.5)
nRBC: 0 % (ref 0.0–0.2)

## 2022-07-13 LAB — BASIC METABOLIC PANEL
Anion gap: 11 (ref 5–15)
BUN: 5 mg/dL — ABNORMAL LOW (ref 6–20)
CO2: 20 mmol/L — ABNORMAL LOW (ref 22–32)
Calcium: 8.7 mg/dL — ABNORMAL LOW (ref 8.9–10.3)
Chloride: 101 mmol/L (ref 98–111)
Creatinine, Ser: 0.7 mg/dL (ref 0.44–1.00)
GFR, Estimated: >60 mL/min (ref >60)
Glucose, Bld: 94 mg/dL (ref 70–99)
Potassium: 4 mmol/L (ref 3.5–5.1)
Sodium: 132 mmol/L — ABNORMAL LOW (ref 135–145)

## 2022-07-13 MED ORDER — MORPHINE SULFATE (PF) 2 MG/ML IV SOLN: 1.0000 mg | Freq: Three times a day (TID) | INTRAVENOUS | Status: DC | PRN

## 2022-07-13 MED ORDER — SODIUM CHLORIDE 0.9% FLUSH
10.0000 mL | Freq: Three times a day (TID) | INTRAVENOUS | Status: DC
  Administered 2022-07-13 – 2022-07-16 (×9): 10 mL via INTRAPLEURAL

## 2022-07-13 MED ORDER — SODIUM CHLORIDE (PF) 0.9 % IJ SOLN
10.0000 mg | Freq: Once | INTRAMUSCULAR | Status: AC
  Administered 2022-07-13: 10 mg via INTRAPLEURAL
  Filled 2022-07-13: qty 10

## 2022-07-13 MED ORDER — ONDANSETRON HCL 4 MG/2ML IJ SOLN
4.0000 mg | Freq: Four times a day (QID) | INTRAMUSCULAR | Status: DC | PRN
  Administered 2022-07-13 – 2022-07-16 (×4): 4 mg via INTRAVENOUS
  Filled 2022-07-13 (×4): qty 2

## 2022-07-13 MED ORDER — METHOCARBAMOL 500 MG PO TABS
1000.0000 mg | ORAL_TABLET | Freq: Three times a day (TID) | ORAL | Status: DC
  Administered 2022-07-13 – 2022-07-19 (×19): 1000 mg via ORAL
  Filled 2022-07-13 (×20): qty 2

## 2022-07-13 MED ORDER — SODIUM CHLORIDE 0.9% FLUSH
10.0000 mL | Freq: Three times a day (TID) | INTRAVENOUS | Status: DC
  Administered 2022-07-13 – 2022-07-17 (×11): 10 mL via INTRAPLEURAL

## 2022-07-13 MED ORDER — STERILE WATER FOR INJECTION IJ SOLN
5.0000 mg | Freq: Once | RESPIRATORY_TRACT | Status: AC
  Administered 2022-07-13: 5 mg via INTRAPLEURAL
  Filled 2022-07-13: qty 5

## 2022-07-13 MED ORDER — MENINGOCOCCAL A C Y&W-135 OLIG IM SOLR
0.5000 mL | Freq: Once | INTRAMUSCULAR | Status: DC
  Filled 2022-07-13: qty 0.5

## 2022-07-13 NOTE — Progress Notes (Signed)
   07/13/22 1000  Mobility  Activity Ambulated independently in hallway  Level of Assistance Independent  Assistive Device None  Distance Ambulated (ft) 250 ft  Activity Response Tolerated well  Mobility Referral Yes  $Mobility charge 1 Mobility   Mobility Specialist Progress Note  Pt in bed and agreeable. Had no c/o pain. Returned to chair w/ all needs met and call bell in reach.   Lucious Groves Mobility Specialist  Please contact via SecureChat or Rehab office at [phone]

## 2022-07-13 NOTE — Plan of Care (Signed)

## 2022-07-13 NOTE — Progress Notes (Signed)
Patient was administered PREVNAR 20 inj 0.53m to right dorsogluteal site, tolerated well. Education provided, pt verbalized understanding.

## 2022-07-13 NOTE — Procedures (Signed)
Pleural Fibrinolytic Administration Procedure Note  Melanie Gowing  [id number]  31-Jan-2001  Date:07/13/22  Time:4:51 PM   Provider Performing:Nikky Duba D. Harris   Procedure: Pleural Fibrinolysis Initial day [id number])  Indication(s) Fibrinolysis of complicated pleural effusion  Consent Risks of the procedure as well as the alternatives and risks of each were explained to the patient and/or caregiver.  Consent for the procedure was obtained.   Anesthesia None   Time Out Verified patient identification, verified procedure, site/side was marked, verified correct patient position, special equipment/implants available, medications/allergies/relevant history reviewed, required imaging and test results available.   Sterile Technique Hand hygiene, gloves   Procedure Description Existing pleural catheter was cleaned and accessed in sterile manner.  '10mg'$  of tPA in 30cc of saline and '5mg'$  of dornase in 30cc of sterile water were injected into pleural space using existing pleural catheter.  Catheter will be clamped for 1 hour and then placed back to suction.   Complications/Tolerance None; patient tolerated the procedure well.  EBL None   Specimen(s) None   Himmat Enberg D. Harris, NP-C [facility] Personal contact information can be found on Amion  If no contact or response made please call [phone] 07/13/2022, 4:52 PM
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
## 2022-07-13 NOTE — Progress Notes (Signed)
OT Cancellation Note  Patient Details Name: Judith [name] MRN: [id number] DOB: 01-21-2001   Cancelled Treatment:    Reason Eval/Treat Not Completed: Other (comment) (pt nauseas, will follow up for OT tx as schedule permits)  Renaye Rakers, OTD, OTR/L SecureChat Preferred Acute Rehab [phone]   Ulla Gallo 07/13/2022, 1:21 PM
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
## 2022-07-13 NOTE — Progress Notes (Signed)
PT Cancellation Note  Patient Details Name: Sharnae Friedrich MRN: [id number] DOB: July 12, 2000   Cancelled Treatment:    Reason Eval/Treat Not Completed: (P) Patient declined, no reason specified (Pt declined mobility, reporting nausea and fatigue from being up with staff earlier. Will continue to follow per POC.)   Michelle Nasuti 07/13/2022, 3:21 PM

## 2022-07-13 NOTE — Progress Notes (Signed)
     [address]       [place] [address]             [phone]       Images and chart reviewed Moderate effusion, output not document Would place CT back to suction Can consider lytics if not risk of bleeding Pt has been afebrile, and on room air.   Ideally can be managed non-surgically.   Will discuss surgery if no improvement following lytic therapy.  Will require CT with IV contrast at that point.  Judith [name]
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
## 2022-07-14 ENCOUNTER — Inpatient Hospital Stay (HOSPITAL_COMMUNITY): Payer: Medicaid Other

## 2022-07-14 DIAGNOSIS — Z9889 Other specified postprocedural states: Secondary | ICD-10-CM | POA: Diagnosis not present

## 2022-07-14 LAB — URINALYSIS, COMPLETE (UACMP) WITH MICROSCOPIC
Glucose, UA: NEGATIVE mg/dL
Hgb urine dipstick: NEGATIVE
Ketones, ur: 20 mg/dL — AB
Leukocytes,Ua: NEGATIVE
Nitrite: NEGATIVE
Protein, ur: NEGATIVE mg/dL
Specific Gravity, Urine: 1.021 (ref 1.005–1.030)
pH: 5 (ref 5.0–8.0)

## 2022-07-14 LAB — CBC
HCT: 32.4 % — ABNORMAL LOW (ref 36.0–46.0)
Hemoglobin: 10.6 g/dL — ABNORMAL LOW (ref 12.0–15.0)
MCH: 27.7 pg (ref 26.0–34.0)
MCHC: 32.7 g/dL (ref 30.0–36.0)
MCV: 84.6 fL (ref 80.0–100.0)
Platelets: 1131 10*3/uL (ref 150–400)
RBC: 3.83 MIL/uL — ABNORMAL LOW (ref 3.87–5.11)
RDW: 17.5 % — ABNORMAL HIGH (ref 11.5–15.5)
WBC: 15.2 10*3/uL — ABNORMAL HIGH (ref 4.0–10.5)
nRBC: 0 % (ref 0.0–0.2)

## 2022-07-14 LAB — BASIC METABOLIC PANEL
Anion gap: 11 (ref 5–15)
BUN: <5 mg/dL — ABNORMAL LOW (ref 6–20)
CO2: 20 mmol/L — ABNORMAL LOW (ref 22–32)
Calcium: 8.5 mg/dL — ABNORMAL LOW (ref 8.9–10.3)
Chloride: 101 mmol/L (ref 98–111)
Creatinine, Ser: 0.73 mg/dL (ref 0.44–1.00)
GFR, Estimated: >60 mL/min (ref >60)
Glucose, Bld: 96 mg/dL (ref 70–99)
Potassium: 3.7 mmol/L (ref 3.5–5.1)
Sodium: 132 mmol/L — ABNORMAL LOW (ref 135–145)

## 2022-07-14 MED ORDER — METRONIDAZOLE 500 MG PO TABS
500.0000 mg | ORAL_TABLET | Freq: Two times a day (BID) | ORAL | Status: DC
  Administered 2022-07-14 – 2022-07-15 (×4): 500 mg via ORAL
  Filled 2022-07-14 (×4): qty 1

## 2022-07-14 MED ORDER — VANCOMYCIN HCL IN DEXTROSE 1-5 GM/200ML-% IV SOLN: 1000.0000 mg | Freq: Two times a day (BID) | INTRAVENOUS | Status: DC

## 2022-07-14 MED ORDER — SODIUM CHLORIDE 0.9 % IV SOLN
2.0000 g | Freq: Three times a day (TID) | INTRAVENOUS | Status: DC
  Administered 2022-07-14 – 2022-07-16 (×6): 2 g via INTRAVENOUS
  Filled 2022-07-14 (×6): qty 12.5

## 2022-07-14 MED ORDER — VANCOMYCIN HCL 1.5 G IV SOLR
1500.0000 mg | Freq: Once | INTRAVENOUS | Status: AC
  Administered 2022-07-14: 1500 mg via INTRAVENOUS
  Filled 2022-07-14: qty 30

## 2022-07-14 MED ORDER — DIPHENHYDRAMINE HCL 50 MG/ML IJ SOLN
12.5000 mg | Freq: Four times a day (QID) | INTRAMUSCULAR | Status: DC | PRN
  Administered 2022-07-14: 12.5 mg via INTRAVENOUS
  Filled 2022-07-14: qty 1

## 2022-07-14 NOTE — Progress Notes (Signed)
   07/14/22 1623  Mobility  Activity Ambulated independently in hallway  Level of Assistance Independent  Assistive Device None  Distance Ambulated (ft) 550 ft  Activity Response Tolerated well  Mobility Referral Yes  $Mobility charge 1 Mobility   Mobility Specialist Progress Note  Pt was in bed and agreeable. Had no c/o pain throughout  walk. Returned to bed w/ all needs met and call bell in reach.   Lucious Groves Mobility Specialist  Please contact via SecureChat or Rehab office at [phone]

## 2022-07-14 NOTE — Progress Notes (Signed)
PT Cancellation Note  Patient Details Name: Judith [name] MRN: [id number] DOB: [birth year]   Cancelled Treatment:    Reason Eval/Treat Not Completed: (P) Patient declined, no reason specified (Attempted to see pt at 9:42am and 3:15pm. Pt reports that she wants to "get something in my stomach first" and states that she will get up at 4pm with mobility specialist. Will continue to follow as schedule allows.)   Michelle Nasuti 07/14/2022, 3:17 PM
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
## 2022-07-14 NOTE — Progress Notes (Addendum)
Pt called this RN in due to burning sensation over body and itching on head and shoulders, no other signs or symptoms reported including difficulty breathing. Pt had first time vanco running, received 400 ml. Stopped vanco, flushed line to verify IV still operating, no infiltration or pain noticed, line flushed easily. Paged trauma PA who ordered diphenhydramine if needed. Alerted pharmacy of possible reaction.Pharmacy said to give benadryl and decrease rate in half and finish the infusion.  Returned gave benadryl then pt said it was burning, IV has infiltrated. Will ask for IV team for pt is a difficult stick.

## 2022-07-14 NOTE — Progress Notes (Signed)
OT Cancellation/Discharge Note  Patient Details Name: Judith [name] MRN: [id number] DOB: Jan 12, 2001   Cancelled Treatment:    Reason Eval/Treat Not Completed: Patient declined, no reason specified (Pt declined OT treatment session this morning, no reason specified. Verbally reviewed OT goals for plan for increased independence with ADL's, transfers and selfcare tasks overall, pt stated "I can do all that. I have been doing that".) Upon further chart review, pt has now cancelled/refused last 4 sessions in a row. Pt denies any further needs for acute OT will sign off at this time secondary to multiple refusals in a row.  Carlynn Herald, Duy Lemming Beth Dixon, OTR/L 07/14/2022, 9:22 AM
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
## 2022-07-15 ENCOUNTER — Inpatient Hospital Stay (HOSPITAL_COMMUNITY): Payer: Medicaid Other

## 2022-07-15 LAB — BASIC METABOLIC PANEL
Anion gap: 10 (ref 5–15)
BUN: 5 mg/dL — ABNORMAL LOW (ref 6–20)
CO2: 21 mmol/L — ABNORMAL LOW (ref 22–32)
Calcium: 8.4 mg/dL — ABNORMAL LOW (ref 8.9–10.3)
Chloride: 103 mmol/L (ref 98–111)
Creatinine, Ser: 0.69 mg/dL (ref 0.44–1.00)
GFR, Estimated: >60 mL/min (ref >60)
Glucose, Bld: 88 mg/dL (ref 70–99)
Potassium: 3.7 mmol/L (ref 3.5–5.1)
Sodium: 134 mmol/L — ABNORMAL LOW (ref 135–145)

## 2022-07-15 LAB — CBC
HCT: 31.3 % — ABNORMAL LOW (ref 36.0–46.0)
Hemoglobin: 10 g/dL — ABNORMAL LOW (ref 12.0–15.0)
MCH: 27.2 pg (ref 26.0–34.0)
MCHC: 31.9 g/dL (ref 30.0–36.0)
MCV: 85.3 fL (ref 80.0–100.0)
Platelets: 1046 10*3/uL (ref 150–400)
RBC: 3.67 MIL/uL — ABNORMAL LOW (ref 3.87–5.11)
RDW: 17.5 % — ABNORMAL HIGH (ref 11.5–15.5)
WBC: 13.3 10*3/uL — ABNORMAL HIGH (ref 4.0–10.5)
nRBC: 0 % (ref 0.0–0.2)

## 2022-07-15 NOTE — Progress Notes (Addendum)
While changing pt breast bandage, purulent sanguinous discharge poured out of puncture hole. Gently pressed around area and copious amounts of drainage came out. Pt had no pain. Applied mupirocin ointment and soft bandage. Pt walked once today and not willing to get up often. Pt not eating or drinking well. Pt agreed to walk around the floor and did very well; pt is independent. Encouraging pt to eat and drink more.

## 2022-07-15 NOTE — TOC Progression Note (Signed)
Transition of Care [facility]) - Progression Note    Patient Details  Name: Judith [name] MRN: [id number] Date of Birth: April 07, 2001  [facility] CM/SW Contact  Ella Bodo, RN Phone Number: 07/15/2022, 4:31 PM  Clinical Narrative:    Patient with continued chest tube to suction with moderate output.  Awaiting decreased output prior to removal. Patient planning to dc home with family to provide assistance.  Will follow for home needs; noted PT/OT recommending RW and tub/shower seat.    Expected Discharge Plan: Home/Self Care Barriers to Discharge: Continued Medical Work up  Expected Discharge Plan and Services   Discharge Planning Services: CM Consult   Living arrangements for the past 2 months: Single Family Home                                       Social Determinants of Health (SDOH) Interventions SDOH Screenings   Food Insecurity: No Food Insecurity (06/30/2022)  Housing: Low Risk  (06/30/2022)  Transportation Needs: No Transportation Needs (06/30/2022)  Utilities: Not At Risk (06/30/2022)    Readmission Risk Interventions     No data to display         Reinaldo Raddle, RN, BSN  Trauma/Neuro ICU Case Manager [phone]
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
## 2022-07-16 LAB — BASIC METABOLIC PANEL
Anion gap: 5 (ref 5–15)
BUN: 6 mg/dL (ref 6–20)
CO2: 25 mmol/L (ref 22–32)
Calcium: 8.3 mg/dL — ABNORMAL LOW (ref 8.9–10.3)
Chloride: 104 mmol/L (ref 98–111)
Creatinine, Ser: 0.62 mg/dL (ref 0.44–1.00)
GFR, Estimated: >60 mL/min (ref >60)
Glucose, Bld: 136 mg/dL — ABNORMAL HIGH (ref 70–99)
Potassium: 3.6 mmol/L (ref 3.5–5.1)
Sodium: 134 mmol/L — ABNORMAL LOW (ref 135–145)

## 2022-07-16 LAB — CBC
HCT: 32.3 % — ABNORMAL LOW (ref 36.0–46.0)
Hemoglobin: 10.7 g/dL — ABNORMAL LOW (ref 12.0–15.0)
MCH: 28.2 pg (ref 26.0–34.0)
MCHC: 33.1 g/dL (ref 30.0–36.0)
MCV: 85 fL (ref 80.0–100.0)
Platelets: 1023 10*3/uL (ref 150–400)
RBC: 3.8 MIL/uL — ABNORMAL LOW (ref 3.87–5.11)
RDW: 17.6 % — ABNORMAL HIGH (ref 11.5–15.5)
WBC: 9.6 10*3/uL (ref 4.0–10.5)
nRBC: 0 % (ref 0.0–0.2)

## 2022-07-16 MED ORDER — DOXYCYCLINE HYCLATE 100 MG PO TABS
100.0000 mg | ORAL_TABLET | Freq: Two times a day (BID) | ORAL | Status: DC
  Administered 2022-07-16 – 2022-07-19 (×8): 100 mg via ORAL
  Filled 2022-07-16 (×8): qty 1
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
## 2022-07-17 LAB — BASIC METABOLIC PANEL
Anion gap: 11 (ref 5–15)
BUN: <5 mg/dL — ABNORMAL LOW (ref 6–20)
CO2: 22 mmol/L (ref 22–32)
Calcium: 8.6 mg/dL — ABNORMAL LOW (ref 8.9–10.3)
Chloride: 102 mmol/L (ref 98–111)
Creatinine, Ser: 0.7 mg/dL (ref 0.44–1.00)
GFR, Estimated: >60 mL/min (ref >60)
Glucose, Bld: 104 mg/dL — ABNORMAL HIGH (ref 70–99)
Potassium: 3.7 mmol/L (ref 3.5–5.1)
Sodium: 135 mmol/L (ref 135–145)

## 2022-07-17 LAB — CBC
HCT: 31.2 % — ABNORMAL LOW (ref 36.0–46.0)
Hemoglobin: 10.3 g/dL — ABNORMAL LOW (ref 12.0–15.0)
MCH: 28.1 pg (ref 26.0–34.0)
MCHC: 33 g/dL (ref 30.0–36.0)
MCV: 85.2 fL (ref 80.0–100.0)
Platelets: 937 10*3/uL (ref 150–400)
RBC: 3.66 MIL/uL — ABNORMAL LOW (ref 3.87–5.11)
RDW: 17.5 % — ABNORMAL HIGH (ref 11.5–15.5)
WBC: 8.1 10*3/uL (ref 4.0–10.5)
nRBC: 0 % (ref 0.0–0.2)

## 2022-07-17 NOTE — Progress Notes (Signed)
   07/17/22 1606  Mobility  Activity Ambulated independently in hallway  Level of Assistance Independent  Assistive Device None  Distance Ambulated (ft) 1100 ft  Activity Response Tolerated well  Mobility Referral Yes  $Mobility charge 1 Mobility   Mobility Specialist Progress Note  Pt was in bed and agreeable. Had no c/o pain. Returned to bed w/ all needs met and call bell in reach.  Lucious Groves Mobility Specialist  Please contact via SecureChat or Rehab office at [phone]
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
## 2022-07-18 LAB — CBC
HCT: 32.2 % — ABNORMAL LOW (ref 36.0–46.0)
Hemoglobin: 10.4 g/dL — ABNORMAL LOW (ref 12.0–15.0)
MCH: 27.5 pg (ref 26.0–34.0)
MCHC: 32.3 g/dL (ref 30.0–36.0)
MCV: 85.2 fL (ref 80.0–100.0)
Platelets: 841 10*3/uL — ABNORMAL HIGH (ref 150–400)
RBC: 3.78 MIL/uL — ABNORMAL LOW (ref 3.87–5.11)
RDW: 17.5 % — ABNORMAL HIGH (ref 11.5–15.5)
WBC: 8.1 10*3/uL (ref 4.0–10.5)
nRBC: 0 % (ref 0.0–0.2)

## 2022-07-18 LAB — BASIC METABOLIC PANEL
Anion gap: 9 (ref 5–15)
BUN: <5 mg/dL — ABNORMAL LOW (ref 6–20)
CO2: 24 mmol/L (ref 22–32)
Calcium: 8.6 mg/dL — ABNORMAL LOW (ref 8.9–10.3)
Chloride: 103 mmol/L (ref 98–111)
Creatinine, Ser: 0.56 mg/dL (ref 0.44–1.00)
GFR, Estimated: >60 mL/min (ref >60)
Glucose, Bld: 96 mg/dL (ref 70–99)
Potassium: 3.6 mmol/L (ref 3.5–5.1)
Sodium: 136 mmol/L (ref 135–145)
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
## 2022-07-18 NOTE — Plan of Care (Signed)

## 2022-07-19 LAB — BASIC METABOLIC PANEL
Anion gap: 10 (ref 5–15)
BUN: 6 mg/dL (ref 6–20)
CO2: 23 mmol/L (ref 22–32)
Calcium: 8.7 mg/dL — ABNORMAL LOW (ref 8.9–10.3)
Chloride: 103 mmol/L (ref 98–111)
Creatinine, Ser: 0.64 mg/dL (ref 0.44–1.00)
GFR, Estimated: >60 mL/min (ref >60)
Glucose, Bld: 104 mg/dL — ABNORMAL HIGH (ref 70–99)
Potassium: 3.7 mmol/L (ref 3.5–5.1)
Sodium: 136 mmol/L (ref 135–145)

## 2022-07-19 LAB — CBC
HCT: 33.5 % — ABNORMAL LOW (ref 36.0–46.0)
Hemoglobin: 10.7 g/dL — ABNORMAL LOW (ref 12.0–15.0)
MCH: 27.4 pg (ref 26.0–34.0)
MCHC: 31.9 g/dL (ref 30.0–36.0)
MCV: 85.7 fL (ref 80.0–100.0)
Platelets: 808 10*3/uL — ABNORMAL HIGH (ref 150–400)
RBC: 3.91 MIL/uL (ref 3.87–5.11)
RDW: 17.6 % — ABNORMAL HIGH (ref 11.5–15.5)
WBC: 9.6 10*3/uL (ref 4.0–10.5)
nRBC: 0 % (ref 0.0–0.2)

## 2022-07-19 LAB — BODY FLUID CULTURE W GRAM STAIN: Special Requests: NORMAL

## 2022-07-19 LAB — AEROBIC/ANAEROBIC CULTURE W GRAM STAIN (SURGICAL/DEEP WOUND): Culture: NO GROWTH
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
## 2022-07-19 NOTE — Progress Notes (Signed)
Pt wants dressing change to be done in the morning by day shift RN

## 2022-07-20 ENCOUNTER — Inpatient Hospital Stay (HOSPITAL_COMMUNITY): Payer: Medicaid Other

## 2022-07-20 ENCOUNTER — Other Ambulatory Visit (HOSPITAL_COMMUNITY): Payer: Self-pay

## 2022-07-20 LAB — BASIC METABOLIC PANEL
Anion gap: 9 (ref 5–15)
BUN: <5 mg/dL — ABNORMAL LOW (ref 6–20)
CO2: 24 mmol/L (ref 22–32)
Calcium: 8.7 mg/dL — ABNORMAL LOW (ref 8.9–10.3)
Chloride: 101 mmol/L (ref 98–111)
Creatinine, Ser: 0.64 mg/dL (ref 0.44–1.00)
GFR, Estimated: >60 mL/min (ref >60)
Glucose, Bld: 88 mg/dL (ref 70–99)
Potassium: 3.5 mmol/L (ref 3.5–5.1)
Sodium: 134 mmol/L — ABNORMAL LOW (ref 135–145)

## 2022-07-20 LAB — CBC
HCT: 33.7 % — ABNORMAL LOW (ref 36.0–46.0)
Hemoglobin: 10.8 g/dL — ABNORMAL LOW (ref 12.0–15.0)
MCH: 27.3 pg (ref 26.0–34.0)
MCHC: 32 g/dL (ref 30.0–36.0)
MCV: 85.1 fL (ref 80.0–100.0)
Platelets: 745 10*3/uL — ABNORMAL HIGH (ref 150–400)
RBC: 3.96 MIL/uL (ref 3.87–5.11)
RDW: 17.4 % — ABNORMAL HIGH (ref 11.5–15.5)
WBC: 7.4 10*3/uL (ref 4.0–10.5)
nRBC: 0 % (ref 0.0–0.2)

## 2022-07-20 MED ORDER — POLYETHYLENE GLYCOL 3350 17 G PO PACK: 17.0000 g | PACK | Freq: Two times a day (BID) | ORAL | 0 refills | Status: DC | PRN

## 2022-07-20 MED ORDER — ACETAMINOPHEN 500 MG PO TABS: 1000.0000 mg | ORAL_TABLET | Freq: Three times a day (TID) | ORAL | 0 refills | Status: DC | PRN

## 2022-07-20 MED ORDER — DOCUSATE SODIUM 100 MG PO CAPS: 100.0000 mg | ORAL_CAPSULE | Freq: Two times a day (BID) | ORAL | 0 refills | Status: DC | PRN

## 2022-07-20 MED ORDER — FLUCONAZOLE 100 MG PO TABS: 400.0000 mg | ORAL_TABLET | Freq: Every day | ORAL | Status: DC

## 2022-07-20 MED ORDER — METHOCARBAMOL 500 MG PO TABS
1000.0000 mg | ORAL_TABLET | Freq: Three times a day (TID) | ORAL | 0 refills | Status: DC | PRN
  Filled 2022-07-20: qty 60, 10d supply, fill #0

## 2022-07-20 MED ORDER — PANTOPRAZOLE SODIUM 40 MG PO TBEC
40.0000 mg | DELAYED_RELEASE_TABLET | Freq: Every day | ORAL | 1 refills | Status: DC
  Filled 2022-07-20: qty 30, 30d supply, fill #0

## 2022-07-20 MED ORDER — FLUCONAZOLE 200 MG PO TABS
400.0000 mg | ORAL_TABLET | Freq: Every day | ORAL | 0 refills | Status: AC
  Filled 2022-07-20: qty 26, 13d supply, fill #0

## 2022-07-20 MED ORDER — OXYCODONE HCL 5 MG PO TABS
5.0000 mg | ORAL_TABLET | Freq: Four times a day (QID) | ORAL | 0 refills | Status: DC | PRN
  Filled 2022-07-20 – 2022-07-29 (×2): qty 15, 4d supply, fill #0
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
## 2022-07-20 NOTE — TOC Transition Note (Addendum)
Transition of Care [facility]) - CM/SW Discharge Note   Patient Details  Name: Stefanie Perri MRN: [id number] Date of Birth: 03-26-2001  [facility] CM/SW Contact:  Ella Bodo, RN Phone Number: 07/20/2022, 1:51 PM   Clinical Narrative:    Patient medically stable for discharge home today with family to provide needed assistance.  PT/OT recommending no OP follow up, RW and tub bench for home.  Pt declines RW, but agrees to tub bench.  Tub bench to be delivered to bedside prior to dc.    Addendum: 2:34pm Patient has changed her mind, and would like RW for home.  Notified [facility] of update. They will deliver to bedside with tub bench.   Final next level of care: Home/Self Care Barriers to Discharge: Barriers Resolved                       Discharge Plan and Services Additional resources added to the After Visit Summary for     Discharge Planning Services: CM Consult            DME Arranged: Tub bench DME Agency: [employer] Date DME Agency Contacted: 07/20/22 Time DME Agency Contacted: [id number] Representative spoke with at DME Agency: [place] ([place] Interventions SDOH Screenings   Food Insecurity: No Food Insecurity (06/30/2022)  Housing: Low Risk  (06/30/2022)  Transportation Needs: No Transportation Needs (06/30/2022)  Utilities: Not At Risk (06/30/2022)     Readmission Risk Interventions     No data to display         Reinaldo Raddle, RN, BSN  Trauma/Neuro ICU Case Manager [phone]
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
## 2022-07-20 NOTE — Discharge Instructions (Addendum)
Wet to Dry WOUND CARE: - Change dressing twice daily - Supplies: sterile saline, kerlex, scissors, ABD pads, tape  Remove dressing and all packing carefully, moistening with sterile saline as needed to avoid packing/internal dressing sticking to the wound. 2.   Clean edges of skin around the wound with water/gauze, making sure there is no tape debris or leakage left on skin that could cause skin irritation or breakdown. 3.   Dampen and clean kerlex with sterile saline and pack wound from wound base to skin level, making sure to take note of any possible areas of wound tracking, tunneling and packing appropriately. Wound can be packed loosely. Trim kerlex to size if a whole kerlex is not required. 4.   Cover wound with a dry ABD pad and secure with tape.  5.   Write the date/time on the dry dressing/tape to better track when the last dressing change occurred. - apply any skin protectant/powder if recommended by clinician to protect skin/skin folds. - change dressing as needed if leakage occurs, wound gets contaminated, or patient requests to shower. - You may shower daily with wound open and following the shower the wound should be dried and a clean dressing placed.  - Medical grade tape as well as packing supplies can be found at [employer] on [address] or [employer] on [address]. The remaining supplies can be found at your local drug store, [employer] etc.    [facility], [place] [phone]  OPEN ABDOMINAL SURGERY: POST OP INSTRUCTIONS  Always review your discharge instruction sheet given to you by the facility where your surgery was performed.  IF YOU HAVE DISABILITY OR FAMILY LEAVE FORMS, YOU MUST BRING THEM TO THE OFFICE FOR PROCESSING.  PLEASE DO NOT GIVE THEM TO YOUR DOCTOR.  A prescription for pain medication may be given to you upon discharge.  Take your pain medication as prescribed, if needed.  If narcotic pain medicine is not  needed, then you may take acetaminophen (Tylenol) as needed. Take your usually prescribed medications unless otherwise directed. If you need a refill on your pain medication, please contact your pharmacy. They will contact our office to request authorization.  Prescriptions will not be filled after 5pm or on week-ends. You should follow a light diet the first few days after arrival home, such as soup and crackers, pudding, etc.unless your doctor has advised otherwise. A high-fiber, low fat diet can be resumed as tolerated.   Be sure to include lots of fluids daily. Most patients will experience some swelling and bruising on the chest and neck area.  Ice packs will help.  Swelling and bruising can take several days to resolve Most patients will experience some swelling and bruising in the area of the incision. Ice pack will help. Swelling and bruising can take several days to resolve..  It is common to experience some constipation if taking pain medication after surgery.  Increasing fluid intake and taking a stool softener will usually help or prevent this problem from occurring.  A mild laxative (Milk of Magnesia or Miralax) should be taken according to package directions if there are no bowel movements after 48 hours. Please see wound care instructions as listed above.  ACTIVITIES:  You may resume regular (light) daily activities beginning the next day--such as daily self-care, walking, climbing stairs--gradually increasing activities as tolerated.  You may have sexual intercourse when it is comfortable.  Refrain from any heavy lifting or straining until  approved by your doctor. You may drive when you no longer are taking prescription pain medication, you can comfortably wear a seatbelt, and you can safely maneuver your car and apply brakes Return to Work: ___________________________________ Judith [name] should see your doctor in the office for a follow-up appointment approximately two weeks after your surgery.   Make sure that you call for this appointment within a day or two after you arrive home to insure a convenient appointment time. OTHER INSTRUCTIONS:  _____________________________________________________________ _____________________________________________________________  WHEN TO CALL YOUR DOCTOR: Fever over 101.0 Inability to urinate Nausea and/or vomiting Extreme swelling or bruising Continued bleeding from incision. Increased pain, redness, or drainage from the incision. Difficulty swallowing or breathing Muscle cramping or spasms. Numbness or tingling in hands or feet or around lips.  The clinic staff is available to answer your questions during regular business hours.  Please don't hesitate to call and ask to speak to one of the nurses if you have concerns.  For further questions, please visit [email or web address]    PNEUMOTHORAX OR HEMOTHORAX +/- RIB FRACTURES  HOME INSTRUCTIONS   Chest tube site wound: you may remove the dressing from your chest tube site 3 days after the removal of your chest tube. DO NOT shower over the dressing. Once   removed, you may shower as normal. Do not submerge your wound in water for 2-3 weeks.  FOLLOW UP  Please call our office to set up or confirm an appointment for follow up for 2 weeks after discharge. You will need to get a chest xray at either [facility] or [facility] This will be outlined in your follow up instructions. Please call [facility] at [phone] if you have any questions about follow up.  If you have any orthopedic or other injuries you will need to follow up as outlined in your follow up instructions.   WHEN TO CALL us [phone]:  Poor pain control Reactions / problems with new medications (rash/itching, nausea, etc)  Fever over 101.5 F (38.5 C) Worsening swelling or bruising Redness, drainage, pain or swelling around chest tube site Worsening pain, productive cough, difficulty breathing or any  other concerning symptoms  The clinic staff is available to answer your questions during regular business hours (8:30am-5pm). Please don't hesitate to call and ask to speak to one of our nurses for clinical concerns.  If you have a medical emergency, go to the nearest emergency room or call 911.  A surgeon from [facility] is always on call at the [facility], [place], [place], [place], [place] [address] ?  MAIN: [phone] ? TOLL FREE: [phone] ?  FAX [phone]  [email or web address]    Pneumothorax A pneumothorax is commonly called a collapsed lung. It is a condition in which air leaks from a lung and builds up between the thin layer of tissue that covers the lungs (visceral pleura) and the interior wall of the chest cavity (parietal pleura). The air gets trapped outside the lung, between the lung and the chest wall (pleural space). The air takes up space and prevents the lung from fully expanding. This condition sometimes occurs suddenly with no apparent cause. The buildup of air may be small or large. A small pneumothorax may go away on its own. A large pneumothorax will require treatment and hospitalization. What are the causes? This condition may be caused by: Trauma and injury to the chest wall. Surgery and other medical procedures. A complication  of an underlying lung problem, especially chronic obstructive pulmonary disease (COPD) or emphysema. Sometimes the cause of this condition is not known. What increases the risk? You are more likely to develop this condition if: You have an underlying lung problem. You smoke. You are 56-67 years old, female, tall, and underweight. You have a personal or family history of pneumothorax. You have an eating disorder (anorexia nervosa). This condition can also happen quickly, even in people with no history of lung problems. What are the signs or symptoms? Sometimes  a pneumothorax will have no symptoms. When symptoms are present, they can include: Chest pain. Shortness of breath. Increased rate of breathing. Bluish color to your lips or skin (cyanosis). How is this diagnosed? This condition may be diagnosed by: A medical history and physical exam. A chest X-ray, chest CT scan, or ultrasound. How is this treated? Treatment depends on how severe your condition is. The goal of treatment is to remove the extra air and allow your lung to expand back to its normal size. For a small pneumothorax: No treatment may be needed. Extra oxygen is sometimes used to make it go away more quickly. For a large pneumothorax or a pneumothorax that is causing symptoms, a procedure is done to drain the air from your lungs. To do this, a health care provider may use: A needle with a syringe. This is used to suck air from a pleural space where no additional leakage is taking place. A chest tube. This is used to suck air where there is ongoing leakage into the pleural space. The chest tube may need to remain in place for several days until the air leak has healed. In more severe cases, surgery may be needed to repair the damage that is causing the leak. If you have multiple pneumothorax episodes or have an air leak that will not heal, a procedure called a pleurodesis may be done. A medicine is placed in the pleural space to irritate the tissues around the lung so that the lung will stick to the chest wall, seal any leaks, and stop any buildup of air in that space. If you have an underlying lung problem, severe symptoms, or a large pneumothorax you will usually need to stay in the hospital. Follow these instructions at home: Lifestyle Do not use any products that contain nicotine or tobacco, such as cigarettes and e-cigarettes. These are major risk factors in pneumothorax. If you need help quitting, ask your health care provider. Do not lift anything that is heavier than 10 lb (4.5  kg), or the limit that your health care provider tells you, until he or she says that it is safe. Avoid activities that take a lot of effort (strenuous) for as long as told by your health care provider. Return to your normal activities as told by your health care provider. Ask your health care provider what activities are safe for you. Do not fly in an airplane or scuba dive until your health care provider says it is okay. General instructions Take over-the-counter and prescription medicines only as told by your health care provider. If a cough or pain makes it difficult for you to sleep at night, try sleeping in a semi-upright position in a recliner or by using 2 or 3 pillows. If you had a chest tube and it was removed, ask your health care provider when you can remove the bandage (dressing). While the dressing is in place, do not allow it to get wet. Keep all follow-up  visits as told by your health care provider. This is important. Contact a health care provider if: You cough up thick mucus (sputum) that is yellow or green in color. You were treated with a chest tube, and you have redness, increasing pain, or discharge at the site where it was placed. Get help right away if: You have increasing chest pain or shortness of breath. You have a cough that will not go away. You begin coughing up blood. You have pain that is getting worse or is not controlled with medicines. The site where your chest tube was located opens up. You feel air coming out of the site where the chest tube was placed. You have a fever or persistent symptoms for more than 2-3 days. You have a fever and your symptoms suddenly get worse. These symptoms may represent a serious problem that is an emergency. Do not wait to see if the symptoms will go away. Get medical help right away. Call your local emergency services (911 in the [place] Do not drive yourself to the hospital. Summary A pneumothorax, commonly called a collapsed  lung, is a condition in which air leaks from a lung and gets trapped between the lung and the chest wall (pleural space). The buildup of air may be small or large. A small pneumothorax may go away on its own. A large pneumothorax will require treatment and hospitalization. Treatment for this condition depends on how severe the pneumothorax is. The goal of treatment is to remove the extra air and allow the lung to expand back to its normal size. This information is not intended to replace advice given to you by your health care provider. Make sure you discuss any questions you have with your health care provider. Document Released: 04/20/2005 Document Revised: 03/29/2017 Document Reviewed: 03/29/2017 Elsevier Interactive Patient Education  2019 [employer].

## 2022-07-21 ENCOUNTER — Encounter (HOSPITAL_BASED_OUTPATIENT_CLINIC_OR_DEPARTMENT_OTHER): Payer: Self-pay | Admitting: Plastic Surgery

## 2022-07-22 ENCOUNTER — Telehealth: Payer: Self-pay | Admitting: *Deleted

## 2022-07-22 ENCOUNTER — Encounter: Payer: Medicaid Other | Admitting: Student
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
## 2022-07-24 ENCOUNTER — Telehealth: Payer: Self-pay | Admitting: Plastic Surgery
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
## 2022-07-24 NOTE — Telephone Encounter (Signed)
Called number provided, no answer. Will call Monday to discuss. Left voicemail
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
## 2022-07-29 ENCOUNTER — Other Ambulatory Visit (HOSPITAL_COMMUNITY): Payer: Self-pay

## 2022-07-29 ENCOUNTER — Encounter (HOSPITAL_BASED_OUTPATIENT_CLINIC_OR_DEPARTMENT_OTHER): Admission: RE | Payer: Self-pay | Source: Home / Self Care

## 2022-07-29 ENCOUNTER — Ambulatory Visit (HOSPITAL_BASED_OUTPATIENT_CLINIC_OR_DEPARTMENT_OTHER): Admission: RE | Admit: 2022-07-29 | Payer: Medicaid Other | Source: Home / Self Care | Admitting: Plastic Surgery

## 2022-07-29 SURGERY — BREAST REDUCTION WITH LIPOSUCTION
Anesthesia: General | Site: Breast | Laterality: Bilateral

## 2022-08-03 ENCOUNTER — Ambulatory Visit (INDEPENDENT_AMBULATORY_CARE_PROVIDER_SITE_OTHER): Payer: Medicaid Other | Admitting: Dermatology

## 2022-08-03 ENCOUNTER — Encounter: Payer: Self-pay | Admitting: Dermatology

## 2022-08-03 VITALS — BP 99/64

## 2022-08-03 DIAGNOSIS — L723 Sebaceous cyst: Secondary | ICD-10-CM | POA: Diagnosis not present

## 2022-08-03 DIAGNOSIS — L732 Hidradenitis suppurativa: Secondary | ICD-10-CM | POA: Diagnosis not present

## 2022-08-03 MED ORDER — MUPIROCIN 2 % EX OINT: 1.0000 | TOPICAL_OINTMENT | Freq: Two times a day (BID) | CUTANEOUS | 1 refills | Status: DC

## 2022-08-03 MED ORDER — CLINDAMYCIN PHOSPHATE 1 % EX LOTN: TOPICAL_LOTION | Freq: Every day | CUTANEOUS | 3 refills | Status: DC

## 2022-08-03 MED ORDER — DOXYCYCLINE HYCLATE 100 MG PO TABS: 100.0000 mg | ORAL_TABLET | Freq: Every day | ORAL | 1 refills | Status: AC

## 2022-08-03 NOTE — Progress Notes (Unsigned)
   New Patient Visit   Subjective  Judith [name] is a 22 y.o. female who presents for the following: Hidradenitis of axillary areas since age 56. She has used Clindamycin lotion in the past. She has a flare under her left arm today. She was scheduled for breast reduction in March but they rescheduled her surgery until she could get treated.    The following portions of the chart were reviewed this encounter and updated as appropriate: medications, allergies, medical history  Review of Systems:  No other skin or systemic complaints except as noted in HPI or Assessment and Plan.  Objective  Well appearing patient in no apparent distress; mood and affect are within normal limits.  A focused examination was performed of the following areas: axillary areas  Relevant exam findings are noted in the Assessment and Plan.    Assessment & Plan   Inflamed sebaceous cyst Left Axilla  Intralesional injection - Left Axilla Location: left axilla  Informed Consent: Discussed risks (infection, pain, bleeding, bruising, thinning of the skin, loss of skin pigment, lack of resolution, and recurrence of lesion) and benefits of the procedure, as well as the alternatives. Informed consent was obtained. Preparation: The area was prepared a standard fashion.  Anesthesia:none  Procedure Details: An intralesional injection was performed with Kenalog 10 mg/cc. 0.1 cc in total were injected. [place] [id number]  Total number of injections: 1  Plan: The patient was instructed on post-op care. Recommend OTC analgesia as needed for pain.     HIDRADENITIS SUPPURATIVA Exam:    Hidradenitis Suppurativa is a chronic; persistent; non-curable, but treatable condition due to abnormal inflamed sweat glands in the body folds (axilla, inframammary, groin, medial thighs), causing recurrent painful draining cysts and scarring. It can be associated with severe scarring acne and cysts; also abscesses and scarring of  scalp. The goal is control and prevention of flares, as it is not curable. Scars are permanent and can be thickened. Treatment may include daily use of topical medication and oral antibiotics.  Oral isotretinoin may also be helpful.  For some cases, Humira or Cosentyx (biologic injections) may be prescribed to decrease the inflammatory process and prevent flares.  When indicated, inflamed cysts may also be treated surgically.  Treatment Plan: Continue Dial Antibacterial wash Continue Clindamycin lotion qd Start Mupirocin oint twice daily to flared areas Doxycycline 100 mg 1 po qd with food and plenty of fluid x 7 days for flares  Return in about 3 months (around 11/02/2022) for Follow up.  I, Ashok Cordia, CMA, am acting as scribe for Ellard Artis, MD .   Documentation: I have reviewed the above documentation for accuracy and completeness, and I agree with the above.  Ellard Artis, MD

## 2022-08-03 NOTE — Patient Instructions (Addendum)
Plan: Counseling I counseled the patient regarding the following: Skin care: Cleanse acneiform lesions and sinus tracts with anti-bacterial washes. Oral antibiotics can help reduce inflammation. Expectations: Hidradenitis Suppurativa is characterized by acneiform lesions and draining sinus tracts in the axillary, abdominal or inguinal folds. Contact office if: Patient develops worsening lesions or fistula formation despite treatment.  I recommended the following Treatments: Topical Antibiotics: Clindamycin Lotion Oral Antibiotics PRN Flares -->   Mupirocin ointment daily to flared areas Continue Dial Antibacterial wash  Due to recent changes in healthcare laws, you may see results of your pathology and/or laboratory studies on MyChart before the doctors have had a chance to review them. We understand that in some cases there may be results that are confusing or concerning to you. Please understand that not all results are received at the same time and often the doctors may need to interpret multiple results in order to provide you with the best plan of care or course of treatment. Therefore, we ask that you please give [place] 2 business days to thoroughly review all your results before contacting the office for clarification. Should we see a critical lab result, you will be contacted sooner.   If You Need Anything After Your Visit  If you have any questions or concerns for your doctor, please call our main line at [phone] If no one answers, please leave a voicemail as directed and we will return your call as soon as possible. Messages left after 4 pm will be answered the following business day.   You may also send [place] a message via [place]. We typically respond to MyChart messages within 1-2 business days.  For prescription refills, please ask your pharmacy to contact our office. Our fax number is [phone].  If you have an urgent issue when the clinic is closed that cannot wait until the  next business day, you can page your doctor at the number below.    Please note that while we do our best to be available for urgent issues outside of office hours, we are not available 24/7.   If you have an urgent issue and are unable to reach [place], you may choose to seek medical care at your doctor's office, retail clinic, urgent care center, or emergency room.  If you have a medical emergency, please immediately call 911 or go to the emergency department. In the event of inclement weather, please call our main line at [phone] for an update on the status of any delays or closures.  Dermatology Medication Tips: Please keep the boxes that topical medications come in in order to help keep track of the instructions about where and how to use these. Pharmacies typically print the medication instructions only on the boxes and not directly on the medication tubes.   If your medication is too expensive, please contact our office at [phone] or send [place] a message through [place].   We are unable to tell what your co-pay for medications will be in advance as this is different depending on your insurance coverage. However, we may be able to find a substitute medication at lower cost or fill out paperwork to get insurance to cover a needed medication.   If a prior authorization is required to get your medication covered by your insurance company, please allow [place] 1-2 business days to complete this process.  Drug prices often vary depending on where the prescription is filled and some pharmacies may offer cheaper prices.  The website www.goodrx.com contains coupons for  medications through different pharmacies. The prices here do not account for what the cost may be with help from insurance (it may be cheaper with your insurance), but the website can give you the price if you did not use any insurance.  - You can print the associated coupon and take it with your prescription to the pharmacy.  - You  may also stop by our office during regular business hours and pick up a GoodRx coupon card.  - If you need your prescription sent electronically to a different pharmacy, notify our office through [facility] or by phone at [phone]

## 2022-08-07 ENCOUNTER — Encounter: Payer: Medicaid Other | Admitting: Plastic Surgery

## 2022-08-12 ENCOUNTER — Ambulatory Visit
Admission: RE | Admit: 2022-08-12 | Discharge: 2022-08-12 | Disposition: A | Discharge status: Home / Self Care | Payer: Medicaid Other | Source: Ambulatory Visit | Attending: General Surgery | Admitting: General Surgery

## 2022-08-12 ENCOUNTER — Other Ambulatory Visit: Payer: Self-pay | Admitting: General Surgery

## 2022-08-12 DIAGNOSIS — J942 Hemothorax: Secondary | ICD-10-CM

## 2022-08-14 ENCOUNTER — Emergency Department (HOSPITAL_COMMUNITY): Payer: Medicaid Other

## 2022-08-14 ENCOUNTER — Emergency Department (HOSPITAL_COMMUNITY)
Admission: EM | Admit: 2022-08-14 | Discharge: 2022-08-14 | Disposition: A | Discharge status: Home / Self Care | Payer: Medicaid Other | Attending: Emergency Medicine | Admitting: Emergency Medicine

## 2022-08-14 ENCOUNTER — Other Ambulatory Visit: Payer: Self-pay

## 2022-08-14 ENCOUNTER — Encounter (HOSPITAL_COMMUNITY): Payer: Self-pay

## 2022-08-14 DIAGNOSIS — R109 Unspecified abdominal pain: Secondary | ICD-10-CM | POA: Diagnosis not present

## 2022-08-14 DIAGNOSIS — R1084 Generalized abdominal pain: Secondary | ICD-10-CM

## 2022-08-14 DIAGNOSIS — R112 Nausea with vomiting, unspecified: Secondary | ICD-10-CM | POA: Diagnosis present

## 2022-08-14 LAB — COMPREHENSIVE METABOLIC PANEL
ALT: 11 U/L (ref 0–44)
AST: 15 U/L (ref 15–41)
Albumin: 3.2 g/dL — ABNORMAL LOW (ref 3.5–5.0)
Alkaline Phosphatase: 82 U/L (ref 38–126)
Anion gap: 8 (ref 5–15)
BUN: <5 mg/dL — ABNORMAL LOW (ref 6–20)
CO2: 24 mmol/L (ref 22–32)
Calcium: 9.1 mg/dL (ref 8.9–10.3)
Chloride: 105 mmol/L (ref 98–111)
Creatinine, Ser: 0.65 mg/dL (ref 0.44–1.00)
GFR, Estimated: >60 mL/min (ref >60)
Glucose, Bld: 128 mg/dL — ABNORMAL HIGH (ref 70–99)
Potassium: 3.5 mmol/L (ref 3.5–5.1)
Sodium: 137 mmol/L (ref 135–145)
Total Bilirubin: 0.6 mg/dL (ref 0.3–1.2)
Total Protein: 8 g/dL (ref 6.5–8.1)

## 2022-08-14 LAB — CBC WITH DIFFERENTIAL/PLATELET
Abs Immature Granulocytes: 0.04 10*3/uL (ref 0.00–0.07)
Basophils Absolute: 0.1 10*3/uL (ref 0.0–0.1)
Basophils Relative: 0 %
Eosinophils Absolute: 0.2 10*3/uL (ref 0.0–0.5)
Eosinophils Relative: 2 %
HCT: 39.3 % (ref 36.0–46.0)
Hemoglobin: 12.7 g/dL (ref 12.0–15.0)
Immature Granulocytes: 0 %
Lymphocytes Relative: 24 %
Lymphs Abs: 3.3 10*3/uL (ref 0.7–4.0)
MCH: 28.4 pg (ref 26.0–34.0)
MCHC: 32.3 g/dL (ref 30.0–36.0)
MCV: 87.9 fL (ref 80.0–100.0)
Monocytes Absolute: 0.8 10*3/uL (ref 0.1–1.0)
Monocytes Relative: 6 %
Neutro Abs: 9.6 10*3/uL — ABNORMAL HIGH (ref 1.7–7.7)
Neutrophils Relative %: 68 %
Platelets: 518 10*3/uL — ABNORMAL HIGH (ref 150–400)
RBC: 4.47 MIL/uL (ref 3.87–5.11)
RDW: 18.1 % — ABNORMAL HIGH (ref 11.5–15.5)
WBC: 14 10*3/uL — ABNORMAL HIGH (ref 4.0–10.5)
nRBC: 0 % (ref 0.0–0.2)

## 2022-08-14 LAB — I-STAT BETA HCG BLOOD, ED (MC, WL, AP ONLY): I-stat hCG, quantitative: <5 m[IU]/mL (ref <5)

## 2022-08-14 LAB — LIPASE, BLOOD: Lipase: 25 U/L (ref 11–51)

## 2022-08-14 MED ORDER — ONDANSETRON HCL 4 MG/2ML IJ SOLN
4.0000 mg | Freq: Once | INTRAMUSCULAR | Status: AC
  Administered 2022-08-14: 4 mg via INTRAVENOUS
  Filled 2022-08-14: qty 2

## 2022-08-14 MED ORDER — HYDROMORPHONE HCL 1 MG/ML IJ SOLN
1.0000 mg | Freq: Once | INTRAMUSCULAR | Status: AC
  Administered 2022-08-14: 1 mg via INTRAVENOUS
  Filled 2022-08-14: qty 1

## 2022-08-14 MED ORDER — IOHEXOL 350 MG/ML SOLN
75.0000 mL | Freq: Once | INTRAVENOUS | Status: AC | PRN
  Administered 2022-08-14: 75 mL via INTRAVENOUS
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
## 2022-08-14 NOTE — ED Provider Notes (Signed)
Patient seen after prior EDP.    Patient is comfortable.  She is taking p.o. well.  She desires discharge.  Patient understands need for close outpatient follow-up.  Strict return precautions given and understood.   Wynetta Fines, MD 08/14/22 [phone]
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
## 2022-08-14 NOTE — Discharge Instructions (Addendum)
Would continue a low fiber/bland diet at discharge for 1-2 weeks. Minimize fried and greasy foods. See attachment.  Return for any problem.
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
## 2022-08-14 NOTE — ED Notes (Signed)
Multiple IV attempts at this time. Unsuccessful.

## 2022-08-14 NOTE — ED Notes (Signed)
RN unsuccessful with IV x1

## 2022-08-17 ENCOUNTER — Encounter: Payer: Medicaid Other | Admitting: Student

## 2022-08-31 ENCOUNTER — Encounter: Payer: Medicaid Other | Admitting: Student

## 2022-10-22 ENCOUNTER — Ambulatory Visit
Admission: EM | Admit: 2022-10-22 | Discharge: 2022-10-22 | Disposition: A | Discharge status: Home / Self Care | Payer: Medicaid Other | Attending: Nurse Practitioner | Admitting: Nurse Practitioner

## 2022-10-22 DIAGNOSIS — Z3201 Encounter for pregnancy test, result positive: Secondary | ICD-10-CM | POA: Diagnosis not present

## 2022-10-22 DIAGNOSIS — N3001 Acute cystitis with hematuria: Secondary | ICD-10-CM | POA: Insufficient documentation

## 2022-10-22 LAB — POCT URINALYSIS DIP (MANUAL ENTRY)
Bilirubin, UA: NEGATIVE
Glucose, UA: NEGATIVE mg/dL
Ketones, POC UA: NEGATIVE mg/dL
Leukocytes, UA: NEGATIVE
Nitrite, UA: NEGATIVE — AB
Protein Ur, POC: 30 mg/dL — AB
Spec Grav, UA: >=1.03 — AB (ref 1.010–1.025)
Urobilinogen, UA: 0.2 E.U./dL
pH, UA: 6 (ref 5.0–8.0)

## 2022-10-22 LAB — POCT URINE PREGNANCY: Preg Test, Ur: POSITIVE — AB

## 2022-10-22 IMAGING — US US OB < 14 WEEKS - US OB TV
1 series · 15 of 28 positions shown · non-contrast
Comparison: None.

CLINICAL DATA: 1st trimester pregnancy of unknown anatomic
location. Unsure of LMP.

EXAM:
OBSTETRIC <14 WK US AND TRANSVAGINAL OB US
TECHNIQUE: Both transabdominal and transvaginal ultrasound examinations were
performed for complete evaluation of the gestation as well as the
maternal uterus, adnexal regions, and pelvic cul-de-sac.
Transvaginal technique was performed to assess early pregnancy.

[Series 1: us ob < 14 weeks - us ob tv · 91 acquisitions, 15 frames shown]
[im 1/91]
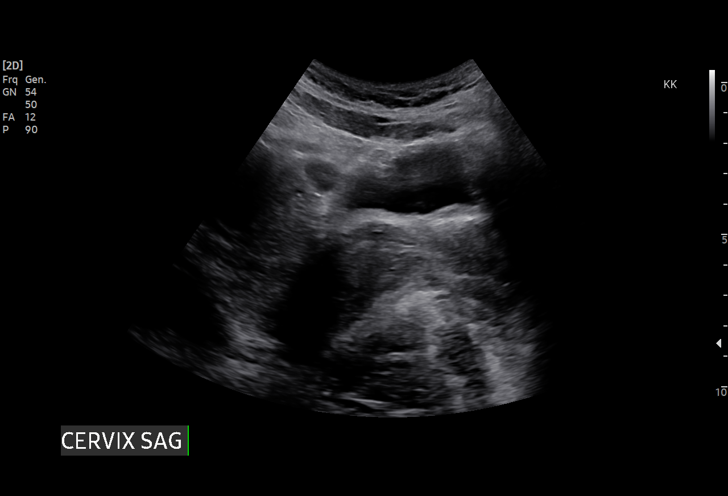
[im 7/91]
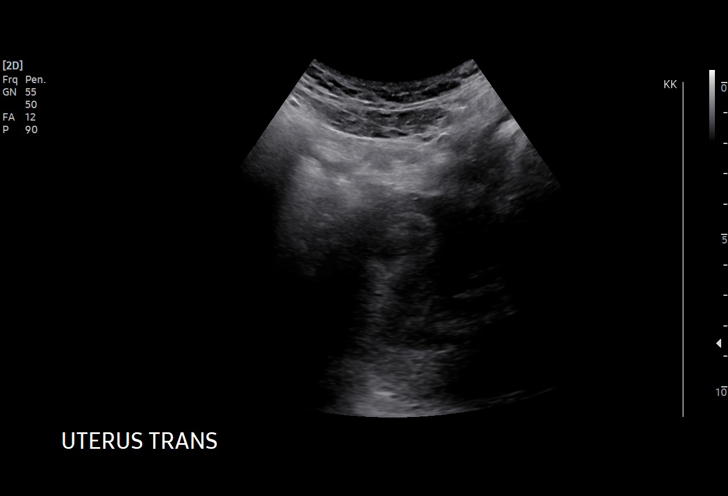
[im 14/91]
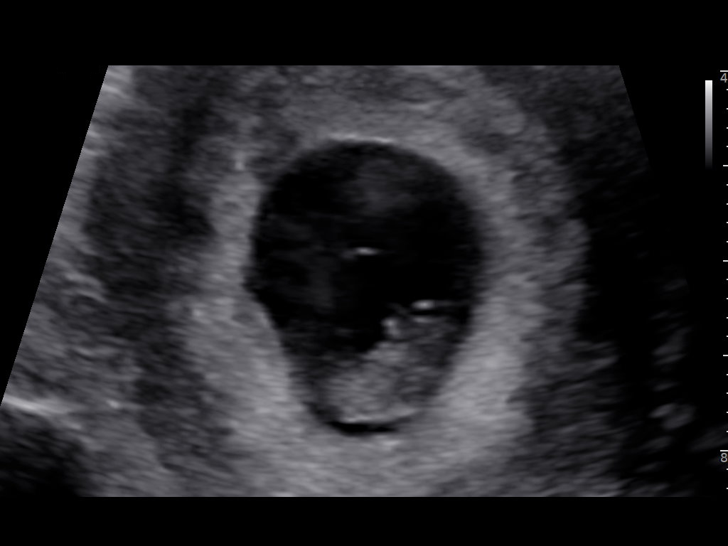
[im 21/91]
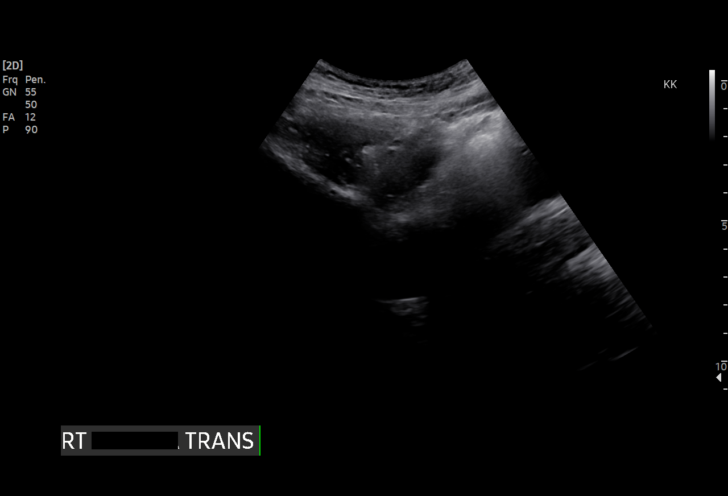
[im 27/91]
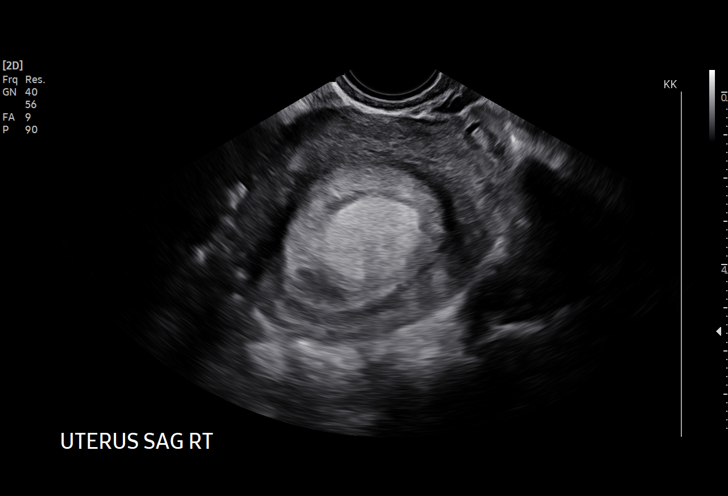
[im 34/91]
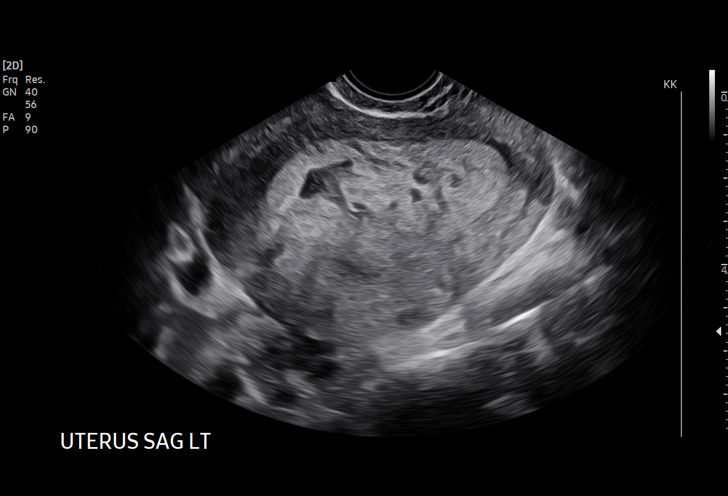
[im 41/91]
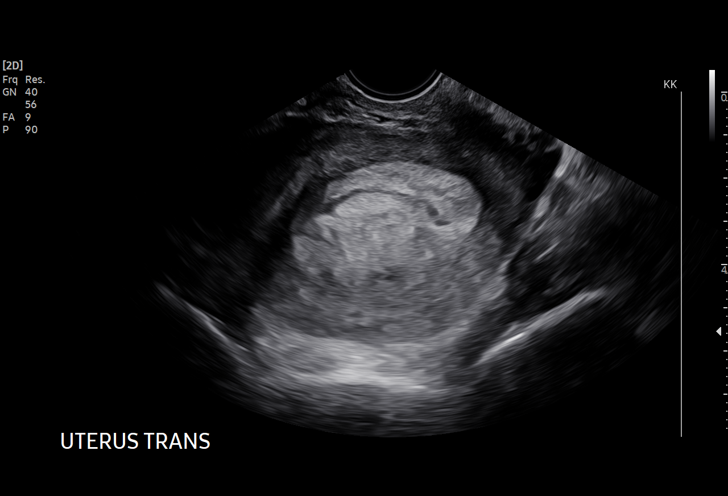
[im 47/91]
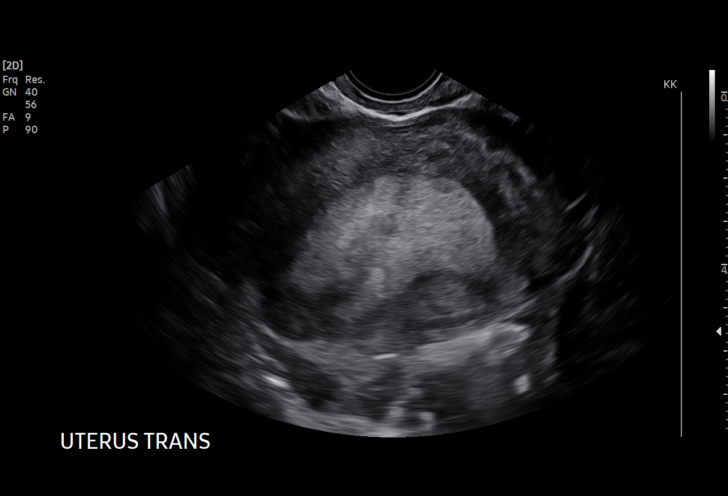
[im 51/91]
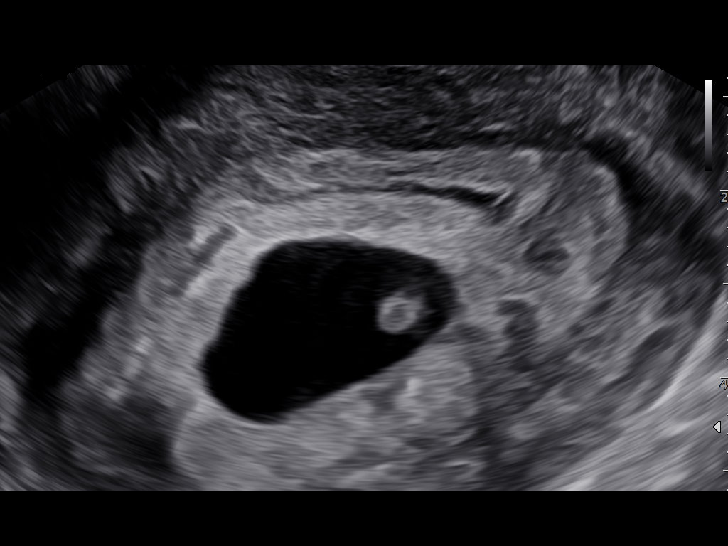
[im 57/91]
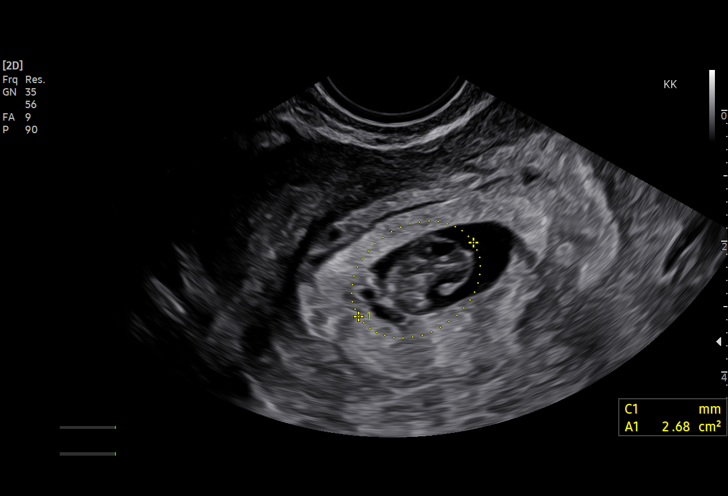
[im 64/91]
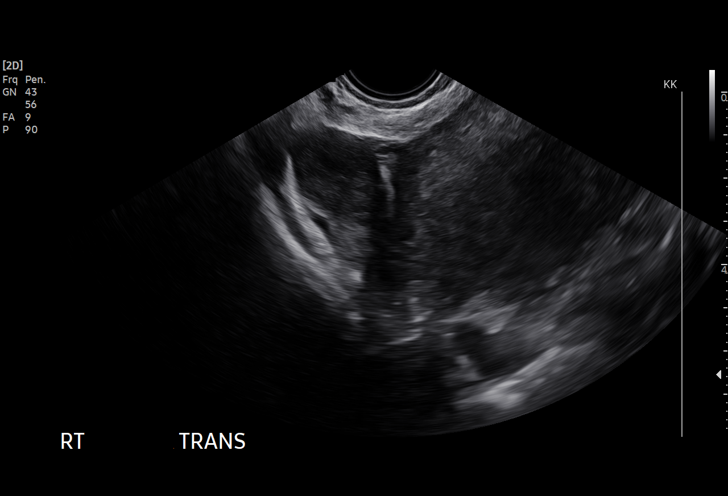
[im 71/91]
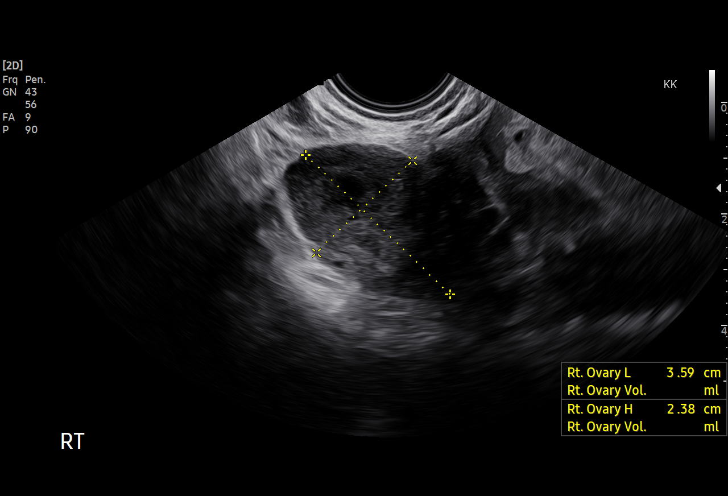
[im 77/91]
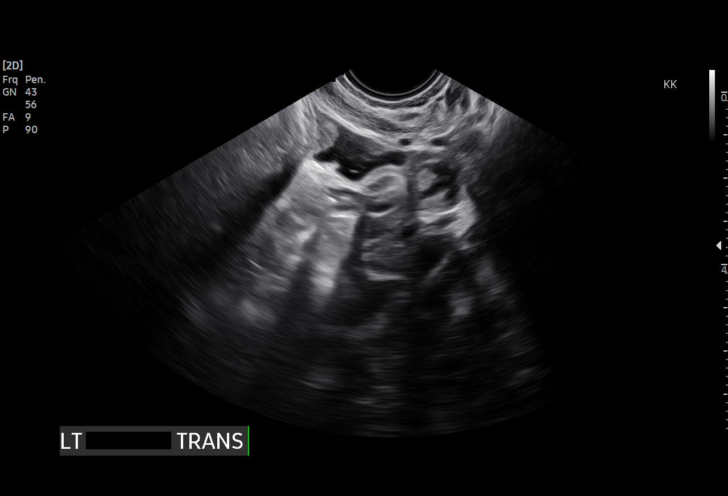
[im 84/91]
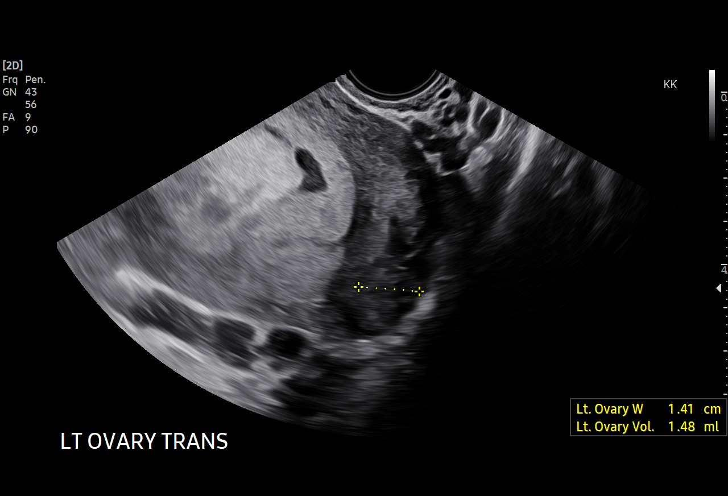
[im 91/91]
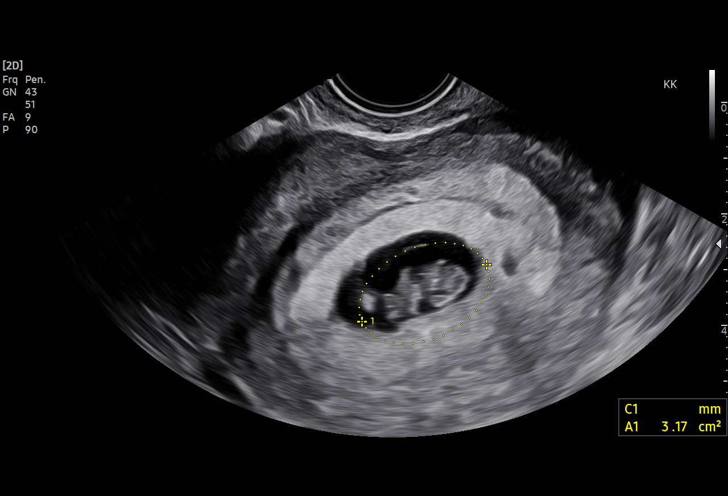

[15 of 28 positions shown; findings below may reference images not displayed]

FINDINGS: Intrauterine gestational sac: Single

Yolk sac:  Visualized.

Embryo:  Visualized.

Cardiac Activity: Visualized.

Heart Rate: 161 bpm

CRL:  17 mm   8 w   1 d                  US EDC: 08/11/2021

Subchorionic hemorrhage:  Small subchorionic hemorrhage noted.

Maternal uterus/adnexae: Small right ovarian corpus luteum noted. No
suspicious masses identified, and no abnormal free fluid seen.
IMPRESSION: Single living IUP with estimated gestational age of 8 weeks 1 day,
and US EDC of 08/11/2021.

Small subchorionic hemorrhage.

## 2022-10-22 MED ORDER — CEPHALEXIN 500 MG PO CAPS
500.0000 mg | ORAL_CAPSULE | Freq: Two times a day (BID) | ORAL | 0 refills | Status: AC
Start: 2022-10-22 — End: 2022-10-29

## 2022-10-22 NOTE — ED Triage Notes (Signed)
Pt requested pregnancy test. Reports increase urinary frequency x 1 week.
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
## 2022-10-22 NOTE — Discharge Instructions (Signed)
Start Keflex twice daily for 7 days.  The clinic will send your urine for a culture and contact you with those results if it indicates a change in your treatment.  Your urine pregnancy test was positive.  Please establish with an obstetrician for prenatal care Follow-up with your PCP if your urinary symptoms do not improve Please go to the emergency room for any worsening symptoms

## 2022-10-23 LAB — URINE CULTURE: Culture: NO GROWTH

## 2022-10-26 ENCOUNTER — Ambulatory Visit (INDEPENDENT_AMBULATORY_CARE_PROVIDER_SITE_OTHER): Payer: Medicaid Other | Admitting: Dermatology

## 2022-10-26 ENCOUNTER — Encounter: Payer: Self-pay | Admitting: Dermatology

## 2022-10-26 DIAGNOSIS — L732 Hidradenitis suppurativa: Secondary | ICD-10-CM | POA: Diagnosis not present

## 2022-10-26 MED ORDER — TRIAMCINOLONE ACETONIDE 10 MG/ML IJ SUSP
10.0000 mg | Freq: Once | INTRAMUSCULAR | Status: AC
Start: 2022-10-26 — End: 2022-10-26
  Administered 2022-10-26: 10 mg

## 2022-10-26 NOTE — Progress Notes (Signed)
   Follow-Up Visit   Subjective  Judith [name] is a 22 y.o. female who presents for the following: HIDRADENITIS SUPPURATIVA  Patient present today for follow up visit for HIDRADENITIS SUPPURATIVA. Patient was last evaluated on 08/03/22. She reports she is still washing with Dial Antibacterial wash She was unable to get the Clindamycin lotion due to insurance. She states she has had about 1-2 flares since her previous visit but responded well to Mupirocin oint to flared areas in combination with oral Doxycycline.Patient reports sxs are better. Patient reports no medication changes. She reports she has noticed that Ice Cream is a trigger so she has cut back on eating it.  The following portions of the chart were reviewed this encounter and updated as appropriate: medications, allergies, medical history  Review of Systems:  No other skin or systemic complaints except as noted in HPI or Assessment and Plan.  Objective  Well appearing patient in no apparent distress; mood and affect are within normal limits.  A focused examination was performed of the following areas: Right Axillae  Relevant exam findings are noted in the Assessment and Plan.  Right Axilla  Hurley Stage 2: single or multiple, widely separated recurrent abscesses with sinus tract formation and scarring     Assessment & Plan   HIDRADENITIS SUPPURATIVA  Right Axilla Flared   Treatment Plan:  Hidradenitis suppurativa Right Axilla  Symptomatic, irritating, patient would like treated.  Benign-appearing.  Call clinic for new or changing lesions.   Prior to procedure, discussed risks of blister formation, small wound, skin dyspigmentation, or rare scar following treatment. Recommend Vaseline ointment to treated areas while healing.  Procedure Note Intralesional Injection  Location: right axilla  Informed Consent: Discussed risks (infection, pain, bleeding, bruising, thinning of the skin, loss of skin pigment,  lack of resolution, and recurrence of lesion) and benefits of the procedure, as well as the alternatives. Informed consent was obtained. Preparation: The area was prepared a standard fashion.  Anesthesia: none  Procedure Details: An intralesional injection was performed with Kenalog 10 mg/cc.   cc in total were injected.  Total number of injections: 8  Plan: The patient was instructed on post-op care. Recommend OTC analgesia as needed for pain.   triamcinolone acetonide (KENALOG) 10 MG/ML injection 10 mg - Right Axilla   .15cc total injected  Return in about 3 months (around 01/26/2023) for HIDRADENITIS SUPPURATIVA F/U.   Documentation: I have reviewed the above documentation for accuracy and completeness, and I agree with the above.  Stasia Cavalier, am acting as scribe for Langston Reusing, DO.  Langston Reusing, DO

## 2022-10-26 NOTE — Patient Instructions (Signed)
Due to recent changes in healthcare laws, you may see results of your pathology and/or laboratory studies on MyChart before the doctors have had a chance to review them. We understand that in some cases there may be results that are confusing or concerning to you. Please understand that not all results are received at the same time and often the doctors may need to interpret multiple results in order to provide you with the best plan of care or course of treatment. Therefore, we ask that you please give us 2 business days to thoroughly review all your results before contacting the office for clarification. Should we see a critical lab result, you will be contacted sooner.   If You Need Anything After Your Visit  If you have any questions or concerns for your doctor, please call our main line at [phone] If no one answers, please leave a voicemail as directed and we will return your call as soon as possible. Messages left after 4 pm will be answered the following business day.   You may also send us a message via MyChart. We typically respond to MyChart messages within 1-2 business days.  For prescription refills, please ask your pharmacy to contact our office. Our fax number is [phone].  If you have an urgent issue when the clinic is closed that cannot wait until the next business day, you can page your doctor at the number below.    Please note that while we do our best to be available for urgent issues outside of office hours, we are not available 24/7.   If you have an urgent issue and are unable to reach us, you may choose to seek medical care at your doctor's office, retail clinic, urgent care center, or emergency room.  If you have a medical emergency, please immediately call 911 or go to the emergency department. In the event of inclement weather, please call our main line at [phone] for an update on the status of any delays or closures.  Dermatology Medication Tips: Please  keep the boxes that topical medications come in in order to help keep track of the instructions about where and how to use these. Pharmacies typically print the medication instructions only on the boxes and not directly on the medication tubes.   If your medication is too expensive, please contact our office at [phone] or send us a message through MyChart.   We are unable to tell what your co-pay for medications will be in advance as this is different depending on your insurance coverage. However, we may be able to find a substitute medication at lower cost or fill out paperwork to get insurance to cover a needed medication.   If a prior authorization is required to get your medication covered by your insurance company, please allow us 1-2 business days to complete this process.  Drug prices often vary depending on where the prescription is filled and some pharmacies may offer cheaper prices.  The website www.goodrx.com contains coupons for medications through different pharmacies. The prices here do not account for what the cost may be with help from insurance (it may be cheaper with your insurance), but the website can give you the price if you did not use any insurance.  - You can print the associated coupon and take it with your prescription to the pharmacy.  - You may also stop by our office during regular business hours and pick up a GoodRx coupon card.  - If you need your   prescription sent electronically to a different pharmacy, notify our office through [facility] MyChart or by phone at [phone]     

## 2022-10-29 ENCOUNTER — Telehealth: Payer: Self-pay

## 2022-10-29 DIAGNOSIS — O3680X0 Pregnancy with inconclusive fetal viability, not applicable or unspecified: Secondary | ICD-10-CM

## 2022-10-29 NOTE — Telephone Encounter (Addendum)
Called pt. Ultrasound appt scheduled for 11/04/22 at 4 PM. Reports minimal spotting today. Reviewed return precautions to MAU for any increased vaginal bleeding or abdominal pain. Called [facility] to confirm receipt of referral and that we were able to contact pt.
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
## 2022-11-02 ENCOUNTER — Ambulatory Visit: Payer: Medicaid Other | Admitting: Dermatology

## 2022-11-02 ENCOUNTER — Inpatient Hospital Stay (HOSPITAL_COMMUNITY)
Admission: AD | Admit: 2022-11-02 | Discharge: 2022-11-02 | Disposition: A | Discharge status: Home / Self Care | Payer: Medicaid Other | Attending: Obstetrics and Gynecology | Admitting: Obstetrics and Gynecology

## 2022-11-02 ENCOUNTER — Encounter (HOSPITAL_COMMUNITY): Payer: Self-pay | Admitting: Obstetrics and Gynecology

## 2022-11-02 DIAGNOSIS — Z3A01 Less than 8 weeks gestation of pregnancy: Secondary | ICD-10-CM | POA: Diagnosis not present

## 2022-11-02 DIAGNOSIS — R1010 Upper abdominal pain, unspecified: Secondary | ICD-10-CM

## 2022-11-02 MED ORDER — PROCHLORPERAZINE MALEATE 10 MG PO TABS: 10.0000 mg | ORAL_TABLET | Freq: Two times a day (BID) | ORAL | 0 refills | Status: DC | PRN

## 2022-11-02 NOTE — Discharge Instructions (Signed)
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
## 2022-11-04 ENCOUNTER — Ambulatory Visit (INDEPENDENT_AMBULATORY_CARE_PROVIDER_SITE_OTHER): Payer: Medicaid Other

## 2022-11-04 DIAGNOSIS — Z3A01 Less than 8 weeks gestation of pregnancy: Secondary | ICD-10-CM

## 2022-11-04 DIAGNOSIS — O3680X0 Pregnancy with inconclusive fetal viability, not applicable or unspecified: Secondary | ICD-10-CM | POA: Diagnosis not present

## 2022-11-30 ENCOUNTER — Other Ambulatory Visit: Payer: Self-pay | Admitting: Obstetrics and Gynecology

## 2022-12-01 ENCOUNTER — Other Ambulatory Visit (HOSPITAL_COMMUNITY): Payer: Self-pay

## 2023-01-03 ENCOUNTER — Ambulatory Visit
Admission: EM | Admit: 2023-01-03 | Discharge: 2023-01-03 | Disposition: A | Discharge status: Home / Self Care | Payer: Medicaid Other | Attending: Internal Medicine | Admitting: Internal Medicine

## 2023-01-03 DIAGNOSIS — B9689 Other specified bacterial agents as the cause of diseases classified elsewhere: Secondary | ICD-10-CM | POA: Diagnosis present

## 2023-01-03 DIAGNOSIS — N760 Acute vaginitis: Secondary | ICD-10-CM | POA: Insufficient documentation

## 2023-01-03 MED ORDER — METRONIDAZOLE 500 MG PO TABS: 500.0000 mg | ORAL_TABLET | Freq: Two times a day (BID) | ORAL | 0 refills | Status: DC

## 2023-01-03 MED ORDER — FLUCONAZOLE 150 MG PO TABS: 150.0000 mg | ORAL_TABLET | ORAL | 0 refills | Status: DC

## 2023-01-03 NOTE — ED Provider Notes (Signed)
[facility]  Note:  This document was prepared using Conservation officer, historic buildings and may include unintentional dictation errors.  MRN: [id number] DOB: 19-Jan-2001  Subjective:   Judith [name] is a 22 y.o. female currently pregnant presenting for 2-day history of acute onset persistent white milky vaginal discharge and vaginal irritation.  Has a history of yeast vaginitis and BV infections.  No concern for pregnancy, STIs.  However, she is not opposed to STI check.  Denies fever, n/v, abdominal pain, pelvic pain, rashes, dysuria, urinary frequency, hematuria.    No current facility-administered medications for this encounter.  Current Outpatient Medications:    acetaminophen (TYLENOL) 500 MG tablet, Take 2 tablets (1,000 mg total) by mouth every 8 (eight) hours as needed for mild pain., Disp: 30 tablet, Rfl: 0   docusate sodium (COLACE) 100 MG capsule, Take 1 capsule (100 mg total) by mouth 2 (two) times daily as needed for mild constipation., Disp: 10 capsule, Rfl: 0   mupirocin ointment (BACTROBAN) 2 %, Apply 1 Application topically 2 (two) times daily., Disp: 22 g, Rfl: 1   ondansetron (ZOFRAN) 4 MG tablet, Take 1 tablet (4 mg total) by mouth every 8 (eight) hours as needed for up to 20 doses for nausea or vomiting., Disp: 20 tablet, Rfl: 0   oxyCODONE (OXY IR/ROXICODONE) 5 MG immediate release tablet, Take 1 tablet (5 mg total) by mouth every 6 (six) hours as needed for severe pain., Disp: 20 tablet, Rfl: 0   pantoprazole (PROTONIX) 40 MG tablet, Take 1 tablet (40 mg total) by mouth daily at 12 noon., Disp: 30 tablet, Rfl: 1   polyethylene glycol (MIRALAX / GLYCOLAX) 17 g packet, Take 17 g by mouth 2 (two) times daily as needed., Disp: 14 each, Rfl: 0   prochlorperazine (COMPAZINE) 10 MG tablet, Take 1 tablet (10 mg total) by mouth 2 (two) times daily as needed for nausea or vomiting., Disp: 60 tablet, Rfl: 0   Allergies  Allergen Reactions   Vancomycin  Itching and Rash    Past Medical History:  Diagnosis Date   Hidradenitis suppurativa    Irregular periods      Past Surgical History:  Procedure Laterality Date   BREAST REDUCTION SURGERY Bilateral 06/17/2022   Procedure: BREAST REDUCTION WITH LIPOSUCTION;  Surgeon: Peggye Form, DO;  Location: [facility];  Service: Plastics;  Laterality: Bilateral;   CHEST TUBE INSERTION  06/28/2022   Procedure: CHEST TUBE INSERTION;  Surgeon: Violeta Gelinas, MD;  Location: [facility] OR;  Service: General;;   LAPAROTOMY N/A 06/28/2022   Procedure: EXPLORATORY LAPAROTOMY;  Surgeon: Violeta Gelinas, MD;  Location: [facility] OR;  Service: General;  Laterality: N/A;   NO PAST SURGERIES     SPLENECTOMY, 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   Procedure: SPLENECTOMY;  Surgeon: Violeta Gelinas, MD;  Location: [facility] OR;  Service: General;;    Family History  Problem Relation Age of Onset   Asthma Mother    Diabetes Mother    Kidney failure Father     Social History   Tobacco Use   Smoking status: Never   Smokeless tobacco: Never  Vaping Use   Vaping status: Former   Substances: Flavoring  Substance Use Topics   Alcohol use: No   Drug use: No    ROS   Objective:   Vitals: BP 121/75 (BP Location: Right Arm)   Pulse (!) 109   Temp 99 F (37.2 C) (Oral)   Resp 18   LMP 12/23/2022 (Exact  Date)   SpO2 98%   Breastfeeding No   Physical Exam Constitutional:      General: She is not in acute distress.    Appearance: Normal appearance. She is well-developed. She is not ill-appearing, toxic-appearing or diaphoretic.  HENT:     Head: Normocephalic and atraumatic.     Nose: Nose normal.     Mouth/Throat:     Mouth: Mucous membranes are moist.  Eyes:     General: No scleral icterus.       Right eye: No discharge.        Left eye: No discharge.     Extraocular Movements: Extraocular movements intact.  Cardiovascular:     Rate and Rhythm: Normal rate.  Pulmonary:     Effort: Pulmonary effort is  normal.  Skin:    General: Skin is warm and dry.  Neurological:     General: No focal deficit present.     Mental Status: She is alert and oriented to person, place, and time.  Psychiatric:        Mood and Affect: Mood normal.        Behavior: Behavior normal.    Patient politely declined an urine pregnancy test.  Assessment and Plan :   PDMP not reviewed this encounter.  1. Bacterial vaginosis   2. Acute vaginitis    We will treat patient empirically at patient's request for bacterial vaginosis with Flagyl and for yeast vaginitis with fluconazole.  Labs pending. Counseled patient on potential for adverse effects with medications prescribed/recommended today, ER and return-to-clinic precautions discussed, patient verbalized understanding.    Wallis Bamberg, [place] 01/03/23 1553

## 2023-01-03 NOTE — ED Triage Notes (Signed)
Pt reports white milky vaginal discharge ad vaginal discomfort x 2 days.

## 2023-01-07 LAB — CERVICOVAGINAL ANCILLARY ONLY
Bacterial Vaginitis (gardnerella): NEGATIVE
Candida Glabrata: NEGATIVE
Candida Vaginitis: POSITIVE — AB
Chlamydia: NEGATIVE
Comment: NEGATIVE
Comment: NEGATIVE
Comment: NEGATIVE
Comment: NEGATIVE
Comment: NEGATIVE
Comment: NORMAL
Neisseria Gonorrhea: NEGATIVE
Trichomonas: NEGATIVE

## 2023-05-14 ENCOUNTER — Encounter (HOSPITAL_COMMUNITY): Payer: Self-pay | Admitting: *Deleted

## 2023-05-14 ENCOUNTER — Other Ambulatory Visit: Payer: Self-pay

## 2023-05-14 ENCOUNTER — Emergency Department (HOSPITAL_COMMUNITY)
Admission: EM | Admit: 2023-05-14 | Discharge: 2023-05-14 | Disposition: A | Discharge status: Home / Self Care | Payer: Medicaid Other | Attending: Emergency Medicine | Admitting: Emergency Medicine

## 2023-05-14 DIAGNOSIS — N644 Mastodynia: Secondary | ICD-10-CM | POA: Insufficient documentation

## 2023-05-14 LAB — CBC
HCT: 36.7 % (ref 36.0–46.0)
Hemoglobin: 12.8 g/dL (ref 12.0–15.0)
MCH: 31.4 pg (ref 26.0–34.0)
MCHC: 34.9 g/dL (ref 30.0–36.0)
MCV: 90.2 fL (ref 80.0–100.0)
Platelets: 403 10*3/uL — ABNORMAL HIGH (ref 150–400)
RBC: 4.07 MIL/uL (ref 3.87–5.11)
RDW: 13.2 % (ref 11.5–15.5)
WBC: 10.6 10*3/uL — ABNORMAL HIGH (ref 4.0–10.5)
nRBC: 0 % (ref 0.0–0.2)

## 2023-05-14 LAB — COMPREHENSIVE METABOLIC PANEL
ALT: 15 U/L (ref 0–44)
AST: 18 U/L (ref 15–41)
Albumin: 3.4 g/dL — ABNORMAL LOW (ref 3.5–5.0)
Alkaline Phosphatase: 58 U/L (ref 38–126)
Anion gap: 9 (ref 5–15)
BUN: 7 mg/dL (ref 6–20)
CO2: 21 mmol/L — ABNORMAL LOW (ref 22–32)
Calcium: 9.1 mg/dL (ref 8.9–10.3)
Chloride: 107 mmol/L (ref 98–111)
Creatinine, Ser: 0.77 mg/dL (ref 0.44–1.00)
GFR, Estimated: >60 mL/min (ref >60)
Glucose, Bld: 119 mg/dL — ABNORMAL HIGH (ref 70–99)
Potassium: 3.8 mmol/L (ref 3.5–5.1)
Sodium: 137 mmol/L (ref 135–145)
Total Bilirubin: 0.5 mg/dL (ref 0.0–1.2)
Total Protein: 7.4 g/dL (ref 6.5–8.1)

## 2023-05-14 MED ORDER — OXYCODONE-ACETAMINOPHEN 5-325 MG PO TABS
1.0000 | ORAL_TABLET | Freq: Once | ORAL | Status: AC
  Administered 2023-05-14: 1 via ORAL
  Filled 2023-05-14: qty 1

## 2023-05-14 MED ORDER — IBUPROFEN 400 MG PO TABS
600.0000 mg | ORAL_TABLET | Freq: Once | ORAL | Status: AC
  Administered 2023-05-14: 600 mg via ORAL
  Filled 2023-05-14: qty 1

## 2023-05-14 NOTE — Discharge Instructions (Signed)
 Follow up with your surgeon as already scheduled.  Use Tylenol and ibuprofen as directed.  You may also use ice periodically
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
## 2023-05-18 ENCOUNTER — Ambulatory Visit: Payer: Self-pay | Admitting: General Surgery

## 2023-05-24 ENCOUNTER — Encounter (HOSPITAL_COMMUNITY): Payer: Self-pay | Admitting: General Surgery

## 2023-05-24 ENCOUNTER — Other Ambulatory Visit: Payer: Self-pay

## 2023-05-24 NOTE — Progress Notes (Signed)
PEDS - [facility] Cardiologist - none  Chest x-ray - 08/12/22 EKG - 08/28/16 Stress Test - n/a ECHO - 11/19/14 CE - [facility] Cardiology (see progress note - normal) Cardiac Cath - n/a  ICD Pacemaker/Loop - n/a  Sleep Study -  n/a CPAP - none  Diabetes - n/a  Blood Thinner Instructions:  n/a  Aspirin Instructions: n/a.  ERAS - Clear liquids until 9:30 AM DOS.  Anesthesia review: n/a  STOP now taking any Aspirin (unless otherwise instructed by your surgeon), Aleve, Naproxen, Ibuprofen, Motrin, Advil, Goody's, BC's, all herbal medications, fish oil, and all vitamins.   Coronavirus Screening Do you have any of the following symptoms:  Cough yes/no: No Fever (>100.62F)  yes/no: No Runny nose yes/no: No Sore throat yes/no: No Difficulty breathing/shortness of breath  yes/no: No  Have you traveled in the last 14 days and where? yes/no: No  Patient verbalized understanding of instructions that were given via phone.

## 2023-05-25 ENCOUNTER — Ambulatory Visit (HOSPITAL_COMMUNITY): Payer: Self-pay | Admitting: Certified Registered Nurse Anesthetist

## 2023-05-25 ENCOUNTER — Other Ambulatory Visit: Payer: Self-pay

## 2023-05-25 ENCOUNTER — Ambulatory Visit (HOSPITAL_COMMUNITY)
Admission: RE | Admit: 2023-05-25 | Discharge: 2023-05-25 | Disposition: A | Discharge status: Home / Self Care | Payer: Medicaid Other | Source: Ambulatory Visit | Attending: General Surgery | Admitting: General Surgery

## 2023-05-25 ENCOUNTER — Encounter (HOSPITAL_COMMUNITY)
Admission: RE | Disposition: A | Discharge status: Home / Self Care | Payer: Self-pay | Source: Ambulatory Visit | Attending: General Surgery

## 2023-05-25 ENCOUNTER — Encounter (HOSPITAL_COMMUNITY): Payer: Self-pay | Admitting: General Surgery

## 2023-05-25 ENCOUNTER — Ambulatory Visit (HOSPITAL_BASED_OUTPATIENT_CLINIC_OR_DEPARTMENT_OTHER): Payer: Medicaid Other | Admitting: Certified Registered Nurse Anesthetist

## 2023-05-25 ENCOUNTER — Other Ambulatory Visit (HOSPITAL_COMMUNITY): Payer: Self-pay

## 2023-05-25 DIAGNOSIS — X58XXXA Exposure to other specified factors, initial encounter: Secondary | ICD-10-CM | POA: Diagnosis not present

## 2023-05-25 DIAGNOSIS — S21042A Puncture wound with foreign body of left breast, initial encounter: Secondary | ICD-10-CM | POA: Insufficient documentation

## 2023-05-25 DIAGNOSIS — S20152A Superficial foreign body of breast, left breast, initial encounter: Secondary | ICD-10-CM | POA: Diagnosis not present

## 2023-05-25 HISTORY — DX: Unspecified asthma, uncomplicated: J45.909

## 2023-05-25 HISTORY — PX: FOREIGN BODY REMOVAL ABDOMINAL: SHX5319

## 2023-05-25 HISTORY — DX: Gastro-esophageal reflux disease without esophagitis: K21.9

## 2023-05-25 LAB — POCT PREGNANCY, URINE: Preg Test, Ur: NEGATIVE

## 2023-05-25 SURGERY — REMOVAL, FOREIGN BODY, ABDOMEN
Anesthesia: General | Site: Breast | Laterality: Left

## 2023-05-25 MED ORDER — BUPIVACAINE-EPINEPHRINE 0.5% -1:200000 IJ SOLN
INTRAMUSCULAR | Status: DC | PRN
  Administered 2023-05-25: 10 mL

## 2023-05-25 MED ORDER — CHLORHEXIDINE GLUCONATE 0.12 % MT SOLN
15.0000 mL | Freq: Once | OROMUCOSAL | Status: AC
  Administered 2023-05-25: 15 mL via OROMUCOSAL
  Filled 2023-05-25: qty 15

## 2023-05-25 MED ORDER — PROPOFOL 10 MG/ML IV BOLUS
INTRAVENOUS | Status: DC | PRN
  Administered 2023-05-25: 150 mg via INTRAVENOUS

## 2023-05-25 MED ORDER — CHLORHEXIDINE GLUCONATE CLOTH 2 % EX PADS: 6.0000 | MEDICATED_PAD | Freq: Once | CUTANEOUS | Status: DC

## 2023-05-25 MED ORDER — FENTANYL CITRATE (PF) 250 MCG/5ML IJ SOLN
INTRAMUSCULAR | Status: AC
  Filled 2023-05-25: qty 5

## 2023-05-25 MED ORDER — ONDANSETRON HCL 4 MG/2ML IJ SOLN
INTRAMUSCULAR | Status: DC | PRN
  Administered 2023-05-25: 4 mg via INTRAVENOUS

## 2023-05-25 MED ORDER — ORAL CARE MOUTH RINSE: 15.0000 mL | Freq: Once | OROMUCOSAL | Status: AC

## 2023-05-25 MED ORDER — OXYCODONE HCL 5 MG PO TABS
5.0000 mg | ORAL_TABLET | Freq: Once | ORAL | Status: AC
Start: 2023-05-25 — End: 2023-05-25
  Administered 2023-05-25: 5 mg via ORAL

## 2023-05-25 MED ORDER — EPHEDRINE 5 MG/ML INJ
INTRAVENOUS | Status: AC
  Filled 2023-05-25: qty 5

## 2023-05-25 MED ORDER — FENTANYL CITRATE (PF) 100 MCG/2ML IJ SOLN
INTRAMUSCULAR | Status: AC
  Filled 2023-05-25: qty 2

## 2023-05-25 MED ORDER — ALBUTEROL SULFATE HFA 108 (90 BASE) MCG/ACT IN AERS
INHALATION_SPRAY | RESPIRATORY_TRACT | Status: AC
  Filled 2023-05-25: qty 6.7

## 2023-05-25 MED ORDER — GABAPENTIN 300 MG PO CAPS
300.0000 mg | ORAL_CAPSULE | ORAL | Status: AC
  Administered 2023-05-25: 300 mg via ORAL
  Filled 2023-05-25: qty 1

## 2023-05-25 MED ORDER — OXYCODONE HCL 5 MG PO TABS
ORAL_TABLET | ORAL | Status: AC
  Filled 2023-05-25: qty 1

## 2023-05-25 MED ORDER — SODIUM CHLORIDE 0.9 % IV SOLN: INTRAVENOUS | Status: DC

## 2023-05-25 MED ORDER — FENTANYL CITRATE (PF) 250 MCG/5ML IJ SOLN
INTRAMUSCULAR | Status: DC | PRN
  Administered 2023-05-25: 100 ug via INTRAVENOUS

## 2023-05-25 MED ORDER — ONDANSETRON HCL 4 MG/2ML IJ SOLN
INTRAMUSCULAR | Status: AC
  Filled 2023-05-25: qty 2

## 2023-05-25 MED ORDER — 0.9 % SODIUM CHLORIDE (POUR BTL) OPTIME
TOPICAL | Status: DC | PRN
  Administered 2023-05-25: 1000 mL

## 2023-05-25 MED ORDER — OXYCODONE HCL 5 MG PO TABS
5.0000 mg | ORAL_TABLET | Freq: Four times a day (QID) | ORAL | 0 refills | Status: DC | PRN
  Filled 2023-05-25: qty 15, 4d supply, fill #0

## 2023-05-25 MED ORDER — BUPIVACAINE-EPINEPHRINE (PF) 0.5% -1:200000 IJ SOLN
INTRAMUSCULAR | Status: AC
Start: 2023-05-25 — End: ?
  Filled 2023-05-25: qty 30

## 2023-05-25 MED ORDER — CEFAZOLIN SODIUM-DEXTROSE 2-4 GM/100ML-% IV SOLN
2.0000 g | INTRAVENOUS | Status: AC
  Administered 2023-05-25: 2 g via INTRAVENOUS
  Filled 2023-05-25: qty 100

## 2023-05-25 MED ORDER — LIDOCAINE 2% (20 MG/ML) 5 ML SYRINGE
INTRAMUSCULAR | Status: DC | PRN
  Administered 2023-05-25: 50 mg via INTRAVENOUS

## 2023-05-25 MED ORDER — MIDAZOLAM HCL 2 MG/2ML IJ SOLN
INTRAMUSCULAR | Status: AC
Start: 2023-05-25 — End: ?
  Filled 2023-05-25: qty 2

## 2023-05-25 MED ORDER — EPHEDRINE SULFATE-NACL 50-0.9 MG/10ML-% IV SOSY
PREFILLED_SYRINGE | INTRAVENOUS | Status: DC | PRN
  Administered 2023-05-25: 5 mg via INTRAVENOUS

## 2023-05-25 MED ORDER — DEXAMETHASONE SODIUM PHOSPHATE 10 MG/ML IJ SOLN
INTRAMUSCULAR | Status: AC
  Filled 2023-05-25: qty 1

## 2023-05-25 MED ORDER — PROPOFOL 10 MG/ML IV BOLUS
INTRAVENOUS | Status: AC
  Filled 2023-05-25: qty 20

## 2023-05-25 MED ORDER — ACETAMINOPHEN 500 MG PO TABS
1000.0000 mg | ORAL_TABLET | ORAL | Status: AC
  Administered 2023-05-25: 1000 mg via ORAL
  Filled 2023-05-25: qty 2

## 2023-05-25 MED ORDER — THROMBIN (RECOMBINANT) 20000 UNITS EX SOLR
CUTANEOUS | Status: AC
  Filled 2023-05-25: qty 20000

## 2023-05-25 MED ORDER — FENTANYL CITRATE (PF) 100 MCG/2ML IJ SOLN
25.0000 ug | INTRAMUSCULAR | Status: DC | PRN
  Administered 2023-05-25 (×2): 25 ug via INTRAVENOUS

## 2023-05-25 SURGICAL SUPPLY — 31 items
BAG COUNTER SPONGE SURGICOUNT (BAG) ×1 IMPLANT
CANISTER SUCT 3000ML PPV (MISCELLANEOUS) IMPLANT
CHLORAPREP W/TINT 26 (MISCELLANEOUS) ×1 IMPLANT
COVER SURGICAL LIGHT HANDLE (MISCELLANEOUS) ×1 IMPLANT
DERMABOND ADVANCED .7 DNX12 (GAUZE/BANDAGES/DRESSINGS) IMPLANT
DRAPE LAPAROTOMY T 102X78X121 (DRAPES) ×1 IMPLANT
ELECT REM PT RETURN 9FT ADLT (ELECTROSURGICAL) ×1
ELECTRODE REM PT RTRN 9FT ADLT (ELECTROSURGICAL) ×1 IMPLANT
GAUZE 4X4 16PLY ~~LOC~~+RFID DBL (SPONGE) ×1 IMPLANT
GAUZE SPONGE 4X4 12PLY STRL (GAUZE/BANDAGES/DRESSINGS) IMPLANT
GLOVE BIO SURGEON STRL SZ8 (GLOVE) ×1 IMPLANT
GLOVE BIOGEL PI IND STRL 8 (GLOVE) ×1 IMPLANT
GOWN STRL REUS W/ TWL LRG LVL3 (GOWN DISPOSABLE) ×1 IMPLANT
GOWN STRL REUS W/ TWL XL LVL3 (GOWN DISPOSABLE) ×1 IMPLANT
KIT BASIN OR (CUSTOM PROCEDURE TRAY) ×1 IMPLANT
KIT TURNOVER KIT B (KITS) ×1 IMPLANT
NDL 22X1.5 STRL (OR ONLY) (MISCELLANEOUS) ×1 IMPLANT
NEEDLE 22X1.5 STRL (OR ONLY) (MISCELLANEOUS) ×1
NS IRRIG 1000ML POUR BTL (IV SOLUTION) ×1 IMPLANT
PACK GENERAL/GYN (CUSTOM PROCEDURE TRAY) ×1 IMPLANT
PAD ARMBOARD 7.5X6 YLW CONV (MISCELLANEOUS) ×1 IMPLANT
PENCIL SMOKE EVACUATOR (MISCELLANEOUS) ×1 IMPLANT
SPECIMEN JAR SMALL (MISCELLANEOUS) ×1 IMPLANT
SPIKE FLUID TRANSFER (MISCELLANEOUS) ×1 IMPLANT
STRIP CLOSURE SKIN 1/2X4 (GAUZE/BANDAGES/DRESSINGS) IMPLANT
SUT MNCRL AB 4-0 PS2 18 (SUTURE) ×1 IMPLANT
SUT VIC AB 3-0 SH 27X BRD (SUTURE) ×1 IMPLANT
SYR 10ML LL (SYRINGE) IMPLANT
SYR CONTROL 10ML LL (SYRINGE) ×1 IMPLANT
TOWEL GREEN STERILE (TOWEL DISPOSABLE) ×1 IMPLANT
TOWEL GREEN STERILE FF (TOWEL DISPOSABLE) ×1 IMPLANT
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
## 2023-05-25 NOTE — Anesthesia Preprocedure Evaluation (Signed)
Anesthesia Evaluation  Patient identified by MRN, date of birth, ID band Patient awake    Reviewed: Allergy & Precautions, NPO status , Patient's Chart, lab work & pertinent test results  Airway Mallampati: II  TM Distance: >3 FB Neck ROM: Full    Dental no notable dental hx.    Pulmonary asthma    Pulmonary exam normal        Cardiovascular negative cardio ROS  Rhythm:Regular Rate:Normal     Neuro/Psych negative neurological ROS  negative psych ROS   GI/Hepatic Neg liver ROS,GERD  ,,  Endo/Other  negative endocrine ROS    Renal/GU negative Renal ROS  negative genitourinary   Musculoskeletal   Abdominal Normal abdominal exam  (+)   Peds  Hematology Lab Results      Component                Value               Date                      WBC                      10.6 (H)            05/14/2023                HGB                      12.8                05/14/2023                HCT                      36.7                05/14/2023                MCV                      90.2                05/14/2023                PLT                      403 (H)             05/14/2023              Anesthesia Other Findings   Reproductive/Obstetrics                             Anesthesia Physical Anesthesia Plan  ASA: 2  Anesthesia Plan: General   Post-op Pain Management: Gabapentin PO (pre-op)* and Tylenol PO (pre-op)*   Induction: Intravenous  PONV Risk Score and Plan: 3 and Ondansetron, Dexamethasone, Midazolam and Treatment may vary due to age or medical condition  Airway Management Planned: Mask and LMA  Additional Equipment: None  Intra-op Plan:   Post-operative Plan: Extubation in OR  Informed Consent: I have reviewed the patients History and Physical, chart, labs and discussed the procedure including the risks, benefits and alternatives for the proposed anesthesia with the patient or  authorized representative who has indicated his/her understanding and acceptance.  Dental advisory given  Plan Discussed with: CRNA  Anesthesia Plan Comments:        Anesthesia Quick Evaluation

## 2023-05-25 NOTE — Anesthesia Procedure Notes (Signed)
Procedure Name: LMA Insertion Date/Time: 05/25/2023 12:56 PM  Performed by: Bartholomew Crews, CRNAPre-anesthesia Checklist: Patient identified, Emergency Drugs available, Suction available, Timeout performed and Patient being monitored Patient Re-evaluated:Patient Re-evaluated prior to induction Oxygen Delivery Method: Circle system utilized Preoxygenation: Pre-oxygenation with 100% oxygen Induction Type: IV induction LMA: LMA inserted LMA Size: 4.0 Number of attempts: 1
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
## 2023-05-26 ENCOUNTER — Encounter (HOSPITAL_COMMUNITY): Payer: Self-pay | Admitting: General Surgery

## 2023-07-02 ENCOUNTER — Ambulatory Visit: Payer: Medicaid Other | Attending: Family Medicine | Admitting: Physical Therapy

## 2023-07-02 ENCOUNTER — Encounter: Payer: Self-pay | Admitting: Physical Therapy

## 2023-07-02 DIAGNOSIS — M5412 Radiculopathy, cervical region: Secondary | ICD-10-CM | POA: Diagnosis present

## 2023-07-02 DIAGNOSIS — M5416 Radiculopathy, lumbar region: Secondary | ICD-10-CM | POA: Diagnosis present

## 2023-07-02 DIAGNOSIS — R293 Abnormal posture: Secondary | ICD-10-CM | POA: Diagnosis present

## 2023-07-02 DIAGNOSIS — M5459 Other low back pain: Secondary | ICD-10-CM | POA: Insufficient documentation

## 2023-07-02 DIAGNOSIS — M6281 Muscle weakness (generalized): Secondary | ICD-10-CM | POA: Insufficient documentation
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
## 2023-07-06 ENCOUNTER — Ambulatory Visit: Payer: Medicaid Other | Attending: Family Medicine | Admitting: Physical Therapy

## 2023-07-06 DIAGNOSIS — M5412 Radiculopathy, cervical region: Secondary | ICD-10-CM | POA: Insufficient documentation

## 2023-07-06 DIAGNOSIS — R293 Abnormal posture: Secondary | ICD-10-CM | POA: Diagnosis present

## 2023-07-06 DIAGNOSIS — M5459 Other low back pain: Secondary | ICD-10-CM | POA: Insufficient documentation

## 2023-07-06 DIAGNOSIS — M5416 Radiculopathy, lumbar region: Secondary | ICD-10-CM | POA: Insufficient documentation

## 2023-07-06 DIAGNOSIS — M6281 Muscle weakness (generalized): Secondary | ICD-10-CM | POA: Diagnosis present

## 2023-07-06 DIAGNOSIS — R2689 Other abnormalities of gait and mobility: Secondary | ICD-10-CM | POA: Diagnosis present
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
## 2023-07-08 ENCOUNTER — Ambulatory Visit: Payer: Medicaid Other | Admitting: Physical Therapy

## 2023-07-08 DIAGNOSIS — M5459 Other low back pain: Secondary | ICD-10-CM

## 2023-07-08 DIAGNOSIS — M6281 Muscle weakness (generalized): Secondary | ICD-10-CM

## 2023-07-08 DIAGNOSIS — R2689 Other abnormalities of gait and mobility: Secondary | ICD-10-CM

## 2023-07-08 DIAGNOSIS — R293 Abnormal posture: Secondary | ICD-10-CM
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
## 2023-07-13 ENCOUNTER — Ambulatory Visit: Payer: Medicaid Other | Admitting: Physical Therapy

## 2023-07-13 ENCOUNTER — Telehealth: Payer: Self-pay | Admitting: Physical Therapy

## 2023-07-15 ENCOUNTER — Ambulatory Visit: Payer: Medicaid Other | Admitting: Physical Therapy

## 2023-07-15 DIAGNOSIS — R293 Abnormal posture: Secondary | ICD-10-CM

## 2023-07-15 DIAGNOSIS — M6281 Muscle weakness (generalized): Secondary | ICD-10-CM

## 2023-07-15 DIAGNOSIS — M5412 Radiculopathy, cervical region: Secondary | ICD-10-CM

## 2023-07-15 DIAGNOSIS — M5416 Radiculopathy, lumbar region: Secondary | ICD-10-CM

## 2023-07-15 DIAGNOSIS — M5459 Other low back pain: Secondary | ICD-10-CM
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
## 2023-07-16 ENCOUNTER — Ambulatory Visit
Admission: EM | Admit: 2023-07-16 | Discharge: 2023-07-16 | Disposition: A | Discharge status: Home / Self Care | Attending: Family Medicine | Admitting: Family Medicine

## 2023-07-16 DIAGNOSIS — N760 Acute vaginitis: Secondary | ICD-10-CM | POA: Diagnosis present

## 2023-07-16 DIAGNOSIS — B9689 Other specified bacterial agents as the cause of diseases classified elsewhere: Secondary | ICD-10-CM | POA: Insufficient documentation

## 2023-07-16 MED ORDER — FLUCONAZOLE 150 MG PO TABS: 150.0000 mg | ORAL_TABLET | ORAL | 0 refills | Status: DC

## 2023-07-16 MED ORDER — METRONIDAZOLE 500 MG PO TABS: 500.0000 mg | ORAL_TABLET | Freq: Two times a day (BID) | ORAL | 0 refills | Status: DC
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
## 2023-07-16 NOTE — ED Triage Notes (Signed)
 Pt states that she has vaginal discharge, vaginal odor and vaginal itching x1 week

## 2023-07-19 LAB — CERVICOVAGINAL ANCILLARY ONLY
Bacterial Vaginitis (gardnerella): NEGATIVE
Candida Glabrata: NEGATIVE
Candida Vaginitis: NEGATIVE
Chlamydia: NEGATIVE
Comment: NEGATIVE
Comment: NEGATIVE
Comment: NEGATIVE
Comment: NEGATIVE
Comment: NEGATIVE
Comment: NORMAL
Neisseria Gonorrhea: NEGATIVE
Trichomonas: NEGATIVE

## 2023-07-20 ENCOUNTER — Ambulatory Visit: Payer: Medicaid Other | Admitting: Physical Therapy

## 2023-07-20 ENCOUNTER — Telehealth: Payer: Self-pay | Admitting: Physical Therapy

## 2023-07-20 NOTE — Telephone Encounter (Signed)
 Called and spoke to pt regarding no-show to scheduled PT appointment. Pt reports she worked over time at work but confirmed next appointment date and time.   Jill Alexanders Amirr Achord, PT, DPT

## 2023-07-22 ENCOUNTER — Ambulatory Visit: Payer: Medicaid Other | Admitting: Physical Therapy

## 2023-07-22 DIAGNOSIS — M5459 Other low back pain: Secondary | ICD-10-CM | POA: Diagnosis not present

## 2023-07-22 DIAGNOSIS — M5416 Radiculopathy, lumbar region: Secondary | ICD-10-CM

## 2023-07-22 DIAGNOSIS — R293 Abnormal posture: Secondary | ICD-10-CM

## 2023-07-22 DIAGNOSIS — M6281 Muscle weakness (generalized): Secondary | ICD-10-CM

## 2023-07-22 DIAGNOSIS — M5412 Radiculopathy, cervical region: Secondary | ICD-10-CM
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
## 2023-07-24 ENCOUNTER — Encounter (HOSPITAL_COMMUNITY): Payer: Self-pay

## 2023-07-24 ENCOUNTER — Emergency Department (HOSPITAL_COMMUNITY)
Admission: EM | Admit: 2023-07-24 | Discharge: 2023-07-24 | Disposition: A | Discharge status: Home / Self Care | Attending: Emergency Medicine | Admitting: Emergency Medicine

## 2023-07-24 DIAGNOSIS — J45909 Unspecified asthma, uncomplicated: Secondary | ICD-10-CM | POA: Insufficient documentation

## 2023-07-24 DIAGNOSIS — R059 Cough, unspecified: Secondary | ICD-10-CM | POA: Diagnosis present

## 2023-07-24 DIAGNOSIS — Z7951 Long term (current) use of inhaled steroids: Secondary | ICD-10-CM | POA: Insufficient documentation

## 2023-07-24 DIAGNOSIS — J020 Streptococcal pharyngitis: Secondary | ICD-10-CM | POA: Insufficient documentation

## 2023-07-24 LAB — RESP PANEL BY RT-PCR (RSV, FLU A&B, COVID)  RVPGX2
Influenza A by PCR: NEGATIVE
Influenza B by PCR: NEGATIVE
Resp Syncytial Virus by PCR: NEGATIVE
SARS Coronavirus 2 by RT PCR: NEGATIVE

## 2023-07-24 LAB — GROUP A STREP BY PCR: Group A Strep by PCR: DETECTED — AB

## 2023-07-24 MED ORDER — DEXAMETHASONE 10 MG/ML FOR PEDIATRIC ORAL USE
10.0000 mg | Freq: Once | INTRAMUSCULAR | Status: AC
Start: 2023-07-24 — End: 2023-07-24
  Administered 2023-07-24: 10 mg via ORAL
  Filled 2023-07-24: qty 1

## 2023-07-24 MED ORDER — PENICILLIN G BENZATHINE 1200000 UNIT/2ML IM SUSY
1.2000 10*6.[IU] | PREFILLED_SYRINGE | Freq: Once | INTRAMUSCULAR | Status: AC
  Administered 2023-07-24: 1.2 10*6.[IU] via INTRAMUSCULAR
  Filled 2023-07-24: qty 2

## 2023-07-24 MED ORDER — IBUPROFEN 400 MG PO TABS
600.0000 mg | ORAL_TABLET | Freq: Once | ORAL | Status: AC
  Administered 2023-07-24: 600 mg via ORAL
  Filled 2023-07-24: qty 1

## 2023-07-24 NOTE — Discharge Instructions (Addendum)
 You have strep throat. COVID/Flu is negative. Take tylenol or motrin as needed for pain. I gave you a dose of a steroid here today that should help with pain. Your strep infection was treated with penicillin, no other antibiotics are needed. If not improving after 48 hours please see primary care provider for recheck.

## 2023-07-24 NOTE — ED Triage Notes (Addendum)
 Pt c/o sore throat, nasal congestion, and cough x2 days.  Pain score 10/10.  Pt hasn't taken anything for symptoms.  Tonsils noted to be red and inflamed.

## 2023-07-24 NOTE — ED Provider Notes (Signed)
 [place] EMERGENCY DEPARTMENT AT [facility] Provider Note   CSN: [id number] Arrival date & time: 07/24/23  1478     History  Chief Complaint  Patient presents with   Sore Throat    Judith [name] is a 23 y.o. female.  Patient with history of asthma here for sore throat, nasal congestion and cough for the past two days. No known fever. Denies vomiting or diarrhea. No rashes. No meds taken prior to arrival.    Sore Throat Pertinent negatives include no abdominal pain.       Home Medications Prior to Admission medications   Medication Sig Start Date End Date Taking? Authorizing Provider  fluconazole (DIFLUCAN) 150 MG tablet Take 1 tablet (150 mg total) by mouth every 3 (three) days. 07/16/23   Wallis Bamberg, PA-C  metroNIDAZOLE (FLAGYL) 500 MG tablet Take 1 tablet (500 mg total) by mouth 2 (two) times daily with a meal. DO NOT CONSUME ALCOHOL WHILE TAKING THIS MEDICATION. 07/16/23   Wallis Bamberg, PA-C  VENTOLIN HFA 108 (90 Base) MCG/ACT inhaler Inhale 2 puffs into the lungs every 6 (six) hours as needed for wheezing or shortness of breath. 12/04/22   [provider]      Allergies    Vancomycin    Review of Systems   Review of Systems  Constitutional:  Negative for fever.  HENT:  Positive for congestion and sore throat.   Respiratory:  Positive for cough.   Gastrointestinal:  Negative for abdominal pain, nausea and vomiting.  Skin:  Negative for rash.  All other systems reviewed and are negative.   Physical Exam Updated Vital Signs BP 122/76   Pulse (!) 105   Temp 98 F (36.7 C)   Resp 18   Ht 4\' 11"  (1.499 m)   Wt 58.1 kg   LMP 07/02/2023   SpO2 95%   BMI 25.85 kg/m  Physical Exam Vitals and nursing note reviewed.  Constitutional:      General: She is not in acute distress.    Appearance: Normal appearance. She is well-developed. She is not ill-appearing, toxic-appearing or diaphoretic.  HENT:     Head: Normocephalic and atraumatic.      Right Ear: Tympanic membrane, ear canal and external ear normal.     Left Ear: Tympanic membrane, ear canal and external ear normal.     Nose: Nose normal.     Mouth/Throat:     Mouth: Mucous membranes are moist.     Pharynx: Oropharynx is clear. Posterior oropharyngeal erythema present. No oropharyngeal exudate.     Tonsils: No tonsillar exudate or tonsillar abscesses. 3+ on the right. 3+ on the left.  Eyes:     Extraocular Movements: Extraocular movements intact.     Conjunctiva/sclera: Conjunctivae normal.     Pupils: Pupils are equal, round, and reactive to light.  Neck:     Meningeal: Brudzinski's sign and Kernig's sign absent.  Cardiovascular:     Rate and Rhythm: Normal rate and regular rhythm.     Pulses: Normal pulses.     Heart sounds: Normal heart sounds. No murmur heard. Pulmonary:     Effort: Pulmonary effort is normal. No tachypnea, accessory muscle usage or respiratory distress.     Breath sounds: Normal breath sounds. No decreased air movement or transmitted upper airway sounds. No wheezing, rhonchi or rales.  Chest:     Chest wall: No tenderness.  Abdominal:     General: Abdomen is flat. Bowel sounds are normal.  Palpations: Abdomen is soft.     Tenderness: There is no abdominal tenderness. There is no guarding or rebound.  Musculoskeletal:        General: No swelling. Normal range of motion.     Cervical back: Full passive range of motion without pain, normal range of motion and neck supple. No rigidity or tenderness.  Skin:    General: Skin is warm and dry.     Capillary Refill: Capillary refill takes less than 2 seconds.     Coloration: Skin is not jaundiced.     Findings: No rash.  Neurological:     General: No focal deficit present.     Mental Status: She is alert and oriented to person, place, and time. Mental status is at baseline.     Motor: No weakness.     Coordination: Coordination normal.     ED Results / Procedures / Treatments   Labs (all  labs ordered are listed, but only abnormal results are displayed) Labs Reviewed  GROUP A STREP BY PCR - Abnormal; Notable for the following components:      Result Value   Group A Strep by PCR DETECTED (*)    All other components within normal limits  RESP PANEL BY RT-PCR (RSV, FLU A&B, COVID)  [id number]    EKG None  Radiology No results found.  Procedures Procedures    Medications Ordered in ED Medications  dexamethasone (DECADRON) 10 MG/ML injection for Pediatric ORAL use 10 mg (has no administration in time range)  ibuprofen (ADVIL) tablet 600 mg (has no administration in time range)  penicillin g benzathine (BICILLIN LA) 1200000 UNIT/2ML injection 1.2 Million Units (has no administration in time range)    ED Course/ Medical Decision Making/ A&P                                 Medical Decision Making Amount and/or Complexity of Data Reviewed Independent Historian: parent  Risk OTC drugs. Prescription drug management.   23 y.o. female with sore throat.  Exam with symmetric enlarged tonsils and erythematous OP, consistent with acute pharyngitis, viral versus bacterial.  Low c/f peritonsillar abscess, retropharyngeal abscess, Ludwig angina. Strep PCR positive. Will give a dose of oral decadron, motrin and treat with IM bicillin.  Recommended symptomatic care with Tylenol or Motrin as needed for sore throat or fevers.  Discouraged use of cough medications. Close follow-up with PCP if not improving.  Return criteria provided for difficulty managing secretions, inability to tolerate p.o., or signs of respiratory distress.  Caregiver expressed understanding.         Final Clinical Impression(s) / ED Diagnoses Final diagnoses:  Strep pharyngitis    Rx / DC Orders ED Discharge Orders     None         Orma Flaming, NP 07/24/23 1024    Johnney Ou, MD 07/24/23 [phone]

## 2023-07-27 ENCOUNTER — Ambulatory Visit: Payer: Medicaid Other | Admitting: Physical Therapy

## 2023-07-27 DIAGNOSIS — M5459 Other low back pain: Secondary | ICD-10-CM
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
## 2023-07-29 ENCOUNTER — Ambulatory Visit: Payer: Medicaid Other | Admitting: Physical Therapy

## 2023-08-03 ENCOUNTER — Ambulatory Visit: Payer: Medicaid Other | Admitting: Physical Therapy

## 2023-08-05 ENCOUNTER — Ambulatory Visit: Payer: Medicaid Other | Admitting: Physical Therapy

## 2023-08-10 ENCOUNTER — Ambulatory Visit: Payer: Medicaid Other | Admitting: Physical Therapy

## 2023-08-12 ENCOUNTER — Ambulatory Visit: Payer: Medicaid Other | Admitting: Physical Therapy

## 2023-08-19 ENCOUNTER — Encounter: Payer: Self-pay | Admitting: Plastic Surgery

## 2023-08-19 ENCOUNTER — Ambulatory Visit (INDEPENDENT_AMBULATORY_CARE_PROVIDER_SITE_OTHER): Admitting: Plastic Surgery

## 2023-08-19 VITALS — BP 129/86 | HR 98 | Ht 59.0 in | Wt 128.0 lb

## 2023-08-19 DIAGNOSIS — M545 Low back pain, unspecified: Secondary | ICD-10-CM | POA: Diagnosis not present

## 2023-08-19 DIAGNOSIS — N62 Hypertrophy of breast: Secondary | ICD-10-CM | POA: Diagnosis not present

## 2023-08-19 DIAGNOSIS — M546 Pain in thoracic spine: Secondary | ICD-10-CM | POA: Diagnosis not present

## 2023-08-19 DIAGNOSIS — G8929 Other chronic pain: Secondary | ICD-10-CM
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
## 2023-08-27 ENCOUNTER — Encounter (HOSPITAL_BASED_OUTPATIENT_CLINIC_OR_DEPARTMENT_OTHER): Payer: Self-pay

## 2023-09-06 ENCOUNTER — Encounter: Payer: Self-pay | Admitting: Physician Assistant

## 2023-09-06 ENCOUNTER — Ambulatory Visit (INDEPENDENT_AMBULATORY_CARE_PROVIDER_SITE_OTHER): Admitting: Physician Assistant

## 2023-09-06 VITALS — BP 125/84 | HR 96 | Ht 59.0 in | Wt 128.8 lb

## 2023-09-06 DIAGNOSIS — N62 Hypertrophy of breast: Secondary | ICD-10-CM

## 2023-09-06 MED ORDER — CEPHALEXIN 500 MG PO CAPS: 500.0000 mg | ORAL_CAPSULE | Freq: Four times a day (QID) | ORAL | 0 refills | Status: AC

## 2023-09-06 MED ORDER — OXYCODONE HCL 5 MG PO TABS: 5.0000 mg | ORAL_TABLET | Freq: Three times a day (TID) | ORAL | 0 refills | Status: AC | PRN

## 2023-09-06 MED ORDER — ONDANSETRON 4 MG PO TBDP: 4.0000 mg | ORAL_TABLET | Freq: Three times a day (TID) | ORAL | 0 refills | Status: AC | PRN
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
## 2023-09-10 ENCOUNTER — Institutional Professional Consult (permissible substitution): Admitting: Plastic Surgery

## 2023-09-16 ENCOUNTER — Telehealth: Payer: Self-pay | Admitting: Plastic Surgery

## 2023-09-16 ENCOUNTER — Encounter (HOSPITAL_BASED_OUTPATIENT_CLINIC_OR_DEPARTMENT_OTHER): Payer: Self-pay | Admitting: Plastic Surgery

## 2023-09-16 ENCOUNTER — Other Ambulatory Visit: Payer: Self-pay

## 2023-09-16 ENCOUNTER — Telehealth (INDEPENDENT_AMBULATORY_CARE_PROVIDER_SITE_OTHER): Payer: Self-pay | Admitting: Physician Assistant

## 2023-09-16 DIAGNOSIS — N62 Hypertrophy of breast: Secondary | ICD-10-CM

## 2023-09-16 MED ORDER — DOXYCYCLINE HYCLATE 100 MG PO TABS: 100.0000 mg | ORAL_TABLET | Freq: Two times a day (BID) | ORAL | 0 refills | Status: AC

## 2023-09-16 NOTE — Telephone Encounter (Signed)
 Patient with a history of macromastia and hidradenitis suppurativa scheduled for breast reduction surgery 09/23/2023 connected via telephone to discuss upcoming surgery.  At the preoperative encounter, she noted that she still has lesions in her right axilla.  This is a persistent and ongoing battle with HS.  Nothing was concerning for infection on exam.  Discussed case with Dr. Orin Birk.  She is still willing to move forward with reduction surgery assuming it does not appear infected on the day of surgery.  Recommended covering with doxycycline  as prophylaxis beginning 5 days prior to surgery.   Patient states that it has gotten even better since the preoperative encounter and feels reassured.  Will take antibiotic as directed.

## 2023-09-16 NOTE — Telephone Encounter (Signed)
 Patient would like to know if you got a response from dr d about photo

## 2023-09-23 ENCOUNTER — Ambulatory Visit (HOSPITAL_BASED_OUTPATIENT_CLINIC_OR_DEPARTMENT_OTHER)
Admission: RE | Admit: 2023-09-23 | Discharge: 2023-09-23 | Disposition: A | Discharge status: Home / Self Care | Source: Ambulatory Visit | Attending: Plastic Surgery | Admitting: Plastic Surgery

## 2023-09-23 ENCOUNTER — Encounter (HOSPITAL_BASED_OUTPATIENT_CLINIC_OR_DEPARTMENT_OTHER)
Admission: RE | Disposition: A | Discharge status: Home / Self Care | Payer: Self-pay | Source: Ambulatory Visit | Attending: Plastic Surgery

## 2023-09-23 ENCOUNTER — Ambulatory Visit (HOSPITAL_BASED_OUTPATIENT_CLINIC_OR_DEPARTMENT_OTHER): Admitting: Anesthesiology

## 2023-09-23 ENCOUNTER — Other Ambulatory Visit: Payer: Self-pay

## 2023-09-23 ENCOUNTER — Encounter (HOSPITAL_BASED_OUTPATIENT_CLINIC_OR_DEPARTMENT_OTHER): Payer: Self-pay | Admitting: Plastic Surgery

## 2023-09-23 DIAGNOSIS — K219 Gastro-esophageal reflux disease without esophagitis: Secondary | ICD-10-CM | POA: Insufficient documentation

## 2023-09-23 DIAGNOSIS — M549 Dorsalgia, unspecified: Secondary | ICD-10-CM | POA: Insufficient documentation

## 2023-09-23 DIAGNOSIS — Z9081 Acquired absence of spleen: Secondary | ICD-10-CM | POA: Insufficient documentation

## 2023-09-23 DIAGNOSIS — N62 Hypertrophy of breast: Secondary | ICD-10-CM | POA: Insufficient documentation

## 2023-09-23 DIAGNOSIS — M542 Cervicalgia: Secondary | ICD-10-CM | POA: Diagnosis not present

## 2023-09-23 DIAGNOSIS — F17290 Nicotine dependence, other tobacco product, uncomplicated: Secondary | ICD-10-CM | POA: Insufficient documentation

## 2023-09-23 DIAGNOSIS — Z01818 Encounter for other preprocedural examination: Secondary | ICD-10-CM

## 2023-09-23 HISTORY — PX: BREAST REDUCTION SURGERY: SHX8

## 2023-09-23 LAB — POCT PREGNANCY, URINE: Preg Test, Ur: NEGATIVE

## 2023-09-23 SURGERY — BREAST REDUCTION WITH LIPOSUCTION
Anesthesia: General | Site: Breast | Laterality: Bilateral

## 2023-09-23 MED ORDER — OXYCODONE HCL 5 MG PO TABS
5.0000 mg | ORAL_TABLET | Freq: Once | ORAL | Status: AC | PRN
  Administered 2023-09-23: 5 mg via ORAL

## 2023-09-23 MED ORDER — DEXMEDETOMIDINE HCL IN NACL 80 MCG/20ML IV SOLN
INTRAVENOUS | Status: AC
  Filled 2023-09-23: qty 20

## 2023-09-23 MED ORDER — AMISULPRIDE (ANTIEMETIC) 5 MG/2ML IV SOLN
INTRAVENOUS | Status: AC
  Filled 2023-09-23: qty 4

## 2023-09-23 MED ORDER — FENTANYL CITRATE (PF) 100 MCG/2ML IJ SOLN
25.0000 ug | INTRAMUSCULAR | Status: DC | PRN
  Administered 2023-09-23: 50 ug via INTRAVENOUS

## 2023-09-23 MED ORDER — MIDAZOLAM HCL 2 MG/2ML IJ SOLN
INTRAMUSCULAR | Status: AC
  Filled 2023-09-23: qty 2

## 2023-09-23 MED ORDER — ROCURONIUM BROMIDE 10 MG/ML (PF) SYRINGE
PREFILLED_SYRINGE | INTRAVENOUS | Status: DC | PRN
  Administered 2023-09-23: 50 mg via INTRAVENOUS

## 2023-09-23 MED ORDER — FENTANYL CITRATE (PF) 100 MCG/2ML IJ SOLN: 25.0000 ug | INTRAMUSCULAR | Status: DC | PRN

## 2023-09-23 MED ORDER — CEFAZOLIN SODIUM-DEXTROSE 2-4 GM/100ML-% IV SOLN
2.0000 g | INTRAVENOUS | Status: AC
  Administered 2023-09-23: 2 g via INTRAVENOUS

## 2023-09-23 MED ORDER — BUPIVACAINE LIPOSOME 1.3 % IJ SUSP
INTRAMUSCULAR | Status: AC
  Filled 2023-09-23: qty 20

## 2023-09-23 MED ORDER — MIDAZOLAM HCL 2 MG/2ML IJ SOLN
INTRAMUSCULAR | Status: DC | PRN
Start: 2023-09-23 — End: 2023-09-23
  Administered 2023-09-23: 2 mg via INTRAVENOUS

## 2023-09-23 MED ORDER — SODIUM CHLORIDE 0.9 % IV SOLN: 250.0000 mL | INTRAVENOUS | Status: DC | PRN

## 2023-09-23 MED ORDER — ACETAMINOPHEN 325 MG RE SUPP: 650.0000 mg | RECTAL | Status: DC | PRN

## 2023-09-23 MED ORDER — SODIUM CHLORIDE 0.9 % IV SOLN
12.5000 mg | INTRAVENOUS | Status: DC | PRN
  Filled 2023-09-23: qty 0.5

## 2023-09-23 MED ORDER — OXYCODONE HCL 5 MG PO TABS
ORAL_TABLET | ORAL | Status: AC
  Filled 2023-09-23: qty 1

## 2023-09-23 MED ORDER — LACTATED RINGERS IV SOLN: INTRAVENOUS | Status: DC

## 2023-09-23 MED ORDER — SCOPOLAMINE 1 MG/3DAYS TD PT72
1.0000 | MEDICATED_PATCH | TRANSDERMAL | Status: DC
  Administered 2023-09-23: 1.5 mg via TRANSDERMAL

## 2023-09-23 MED ORDER — PHENYLEPHRINE 80 MCG/ML (10ML) SYRINGE FOR IV PUSH (FOR BLOOD PRESSURE SUPPORT)
PREFILLED_SYRINGE | INTRAVENOUS | Status: DC | PRN
  Administered 2023-09-23 (×2): 80 ug via INTRAVENOUS

## 2023-09-23 MED ORDER — ACETAMINOPHEN 500 MG PO TABS
ORAL_TABLET | ORAL | Status: AC
  Filled 2023-09-23: qty 2

## 2023-09-23 MED ORDER — ONDANSETRON HCL 4 MG/2ML IJ SOLN
INTRAMUSCULAR | Status: DC | PRN
  Administered 2023-09-23: 4 mg via INTRAVENOUS

## 2023-09-23 MED ORDER — AMISULPRIDE (ANTIEMETIC) 5 MG/2ML IV SOLN
10.0000 mg | Freq: Once | INTRAVENOUS | Status: AC | PRN
  Administered 2023-09-23: 10 mg via INTRAVENOUS

## 2023-09-23 MED ORDER — SODIUM CHLORIDE 0.9% FLUSH: 3.0000 mL | Freq: Two times a day (BID) | INTRAVENOUS | Status: DC

## 2023-09-23 MED ORDER — DEXAMETHASONE SODIUM PHOSPHATE 10 MG/ML IJ SOLN
INTRAMUSCULAR | Status: AC
  Filled 2023-09-23: qty 1

## 2023-09-23 MED ORDER — ONDANSETRON HCL 4 MG/2ML IJ SOLN
INTRAMUSCULAR | Status: AC
  Filled 2023-09-23: qty 2

## 2023-09-23 MED ORDER — ROCURONIUM BROMIDE 10 MG/ML (PF) SYRINGE
PREFILLED_SYRINGE | INTRAVENOUS | Status: AC
  Filled 2023-09-23: qty 10

## 2023-09-23 MED ORDER — CELECOXIB 200 MG PO CAPS
200.0000 mg | ORAL_CAPSULE | Freq: Once | ORAL | Status: AC
Start: 2023-09-23 — End: 2023-09-23
  Administered 2023-09-23: 200 mg via ORAL

## 2023-09-23 MED ORDER — CHLORHEXIDINE GLUCONATE CLOTH 2 % EX PADS
6.0000 | MEDICATED_PAD | Freq: Once | CUTANEOUS | Status: DC
Start: 2023-09-23 — End: 2023-09-23

## 2023-09-23 MED ORDER — SUGAMMADEX SODIUM 200 MG/2ML IV SOLN
INTRAVENOUS | Status: DC | PRN
  Administered 2023-09-23: 150 mg via INTRAVENOUS

## 2023-09-23 MED ORDER — FENTANYL CITRATE (PF) 100 MCG/2ML IJ SOLN
INTRAMUSCULAR | Status: DC | PRN
  Administered 2023-09-23: 100 ug via INTRAVENOUS

## 2023-09-23 MED ORDER — LIDOCAINE 2% (20 MG/ML) 5 ML SYRINGE
INTRAMUSCULAR | Status: DC | PRN
  Administered 2023-09-23: 40 mg via INTRAVENOUS

## 2023-09-23 MED ORDER — LIDOCAINE-EPINEPHRINE 1 %-1:100000 IJ SOLN
INTRAMUSCULAR | Status: DC | PRN
  Administered 2023-09-23: 40 mL

## 2023-09-23 MED ORDER — FENTANYL CITRATE (PF) 100 MCG/2ML IJ SOLN
INTRAMUSCULAR | Status: AC
  Filled 2023-09-23: qty 2

## 2023-09-23 MED ORDER — PROPOFOL 10 MG/ML IV BOLUS
INTRAVENOUS | Status: DC | PRN
  Administered 2023-09-23: 150 mg via INTRAVENOUS

## 2023-09-23 MED ORDER — SODIUM CHLORIDE 0.9 % IV SOLN
INTRAVENOUS | Status: DC | PRN
  Administered 2023-09-23: 40 mL

## 2023-09-23 MED ORDER — ACETAMINOPHEN 500 MG PO TABS
1000.0000 mg | ORAL_TABLET | Freq: Once | ORAL | Status: AC
  Administered 2023-09-23: 1000 mg via ORAL

## 2023-09-23 MED ORDER — OXYCODONE HCL 5 MG PO TABS: 5.0000 mg | ORAL_TABLET | ORAL | Status: DC | PRN

## 2023-09-23 MED ORDER — DEXAMETHASONE SODIUM PHOSPHATE 10 MG/ML IJ SOLN
INTRAMUSCULAR | Status: DC | PRN
  Administered 2023-09-23: 10 mg via INTRAVENOUS

## 2023-09-23 MED ORDER — LIDOCAINE 2% (20 MG/ML) 5 ML SYRINGE
INTRAMUSCULAR | Status: AC
  Filled 2023-09-23: qty 5

## 2023-09-23 MED ORDER — VASHE WOUND IRRIGATION OPTIME
TOPICAL | Status: DC | PRN
  Administered 2023-09-23: 34 [oz_av]

## 2023-09-23 MED ORDER — CELECOXIB 200 MG PO CAPS
ORAL_CAPSULE | ORAL | Status: AC
  Filled 2023-09-23: qty 1

## 2023-09-23 MED ORDER — OXYCODONE HCL 5 MG/5ML PO SOLN: 5.0000 mg | Freq: Once | ORAL | Status: AC | PRN

## 2023-09-23 MED ORDER — SCOPOLAMINE 1 MG/3DAYS TD PT72
MEDICATED_PATCH | TRANSDERMAL | Status: AC
  Filled 2023-09-23: qty 1

## 2023-09-23 MED ORDER — SODIUM CHLORIDE 0.9% FLUSH: 3.0000 mL | INTRAVENOUS | Status: DC | PRN

## 2023-09-23 MED ORDER — ACETAMINOPHEN 325 MG PO TABS: 650.0000 mg | ORAL_TABLET | ORAL | Status: DC | PRN

## 2023-09-23 MED ORDER — CEFAZOLIN SODIUM-DEXTROSE 2-4 GM/100ML-% IV SOLN
INTRAVENOUS | Status: AC
  Filled 2023-09-23: qty 100

## 2023-09-23 SURGICAL SUPPLY — 62 items
BINDER BREAST LRG (GAUZE/BANDAGES/DRESSINGS) IMPLANT
BINDER BREAST MEDIUM (GAUZE/BANDAGES/DRESSINGS) IMPLANT
BINDER BREAST XLRG (GAUZE/BANDAGES/DRESSINGS) IMPLANT
BINDER BREAST XXLRG (GAUZE/BANDAGES/DRESSINGS) IMPLANT
BIOPATCH RED 1 DISK 7.0 (GAUZE/BANDAGES/DRESSINGS) IMPLANT
BLADE HEX COATED 2.75 (ELECTRODE) IMPLANT
BLADE KNIFE PERSONA 10 (BLADE) ×2 IMPLANT
BLADE SURG 15 STRL LF DISP TIS (BLADE) ×1 IMPLANT
CANISTER SUCT 1200ML W/VALVE (MISCELLANEOUS) ×1 IMPLANT
CLEANSER WND VASHE 34 (WOUND CARE) ×1 IMPLANT
COVER BACK TABLE 60X90IN (DRAPES) ×1 IMPLANT
COVER MAYO STAND STRL (DRAPES) ×1 IMPLANT
DERMABOND ADVANCED .7 DNX12 (GAUZE/BANDAGES/DRESSINGS) ×2 IMPLANT
DRAIN CHANNEL 15F RND FF W/TCR (WOUND CARE) IMPLANT
DRAIN CHANNEL 19F RND (DRAIN) IMPLANT
DRAPE LAPAROSCOPIC ABDOMINAL (DRAPES) ×1 IMPLANT
DRAPE UTILITY XL STRL (DRAPES) ×1 IMPLANT
DRSG MEPILEX POST OP 4X8 (GAUZE/BANDAGES/DRESSINGS) ×2 IMPLANT
DRSG TEGADERM 4X4.75 (GAUZE/BANDAGES/DRESSINGS) IMPLANT
ELECTRODE BLDE 4.0 EZ CLN MEGD (MISCELLANEOUS) ×1 IMPLANT
ELECTRODE REM PT RTRN 9FT ADLT (ELECTROSURGICAL) ×1 IMPLANT
EVACUATOR SILICONE 100CC (DRAIN) IMPLANT
GAUZE PAD ABD 8X10 STRL (GAUZE/BANDAGES/DRESSINGS) ×2 IMPLANT
GLOVE BIO SURGEON STRL SZ 6.5 (GLOVE) ×2 IMPLANT
GLOVE BIO SURGEON STRL SZ7.5 (GLOVE) ×1 IMPLANT
GLOVE BIOGEL PI IND STRL 7.0 (GLOVE) IMPLANT
GLOVE BIOGEL PI IND STRL 8 (GLOVE) IMPLANT
GOWN STRL REUS W/ TWL LRG LVL3 (GOWN DISPOSABLE) ×2 IMPLANT
GOWN STRL REUS W/ TWL XL LVL3 (GOWN DISPOSABLE) IMPLANT
LINER CANISTER 1000CC FLEX (MISCELLANEOUS) ×1 IMPLANT
NDL FILTER BLUNT 18X1 1/2 (NEEDLE) IMPLANT
NDL HYPO 25X1 1.5 SAFETY (NEEDLE) ×2 IMPLANT
NEEDLE FILTER BLUNT 18X1 1/2 (NEEDLE) IMPLANT
NEEDLE HYPO 25X1 1.5 SAFETY (NEEDLE) ×2 IMPLANT
NS IRRIG 1000ML POUR BTL (IV SOLUTION) IMPLANT
PACK BASIN DAY SURGERY FS (CUSTOM PROCEDURE TRAY) ×1 IMPLANT
PAD ALCOHOL SWAB (MISCELLANEOUS) IMPLANT
PAD FOAM SILICONE BACKED (GAUZE/BANDAGES/DRESSINGS) IMPLANT
PENCIL SMOKE EVACUATOR (MISCELLANEOUS) ×1 IMPLANT
PIN SAFETY STERILE (MISCELLANEOUS) IMPLANT
POWDER MYRIAD MORCLLS FINE 500 (Miscellaneous) IMPLANT
SLEEVE SCD COMPRESS KNEE MED (STOCKING) ×1 IMPLANT
SPIKE FLUID TRANSFER (MISCELLANEOUS) IMPLANT
SPONGE T-LAP 18X18 ~~LOC~~+RFID (SPONGE) ×2 IMPLANT
STRIP SUTURE WOUND CLOSURE 1/2 (MISCELLANEOUS) ×4 IMPLANT
SUT MNCRL AB 4-0 PS2 18 (SUTURE) ×4 IMPLANT
SUT MON AB 3-0 SH27 (SUTURE) ×4 IMPLANT
SUT MON AB 5-0 PS2 18 (SUTURE) IMPLANT
SUT PDS 3-0 CT2 (SUTURE) ×5 IMPLANT
SUT PDS II 3-0 CT2 27 ABS (SUTURE) ×4 IMPLANT
SUT SILK 3 0 PS 1 (SUTURE) IMPLANT
SYR 50ML LL SCALE MARK (SYRINGE) IMPLANT
SYR BULB IRRIG 60ML STRL (SYRINGE) ×1 IMPLANT
SYR CONTROL 10ML LL (SYRINGE) ×2 IMPLANT
TAPE MEASURE VINYL STERILE (MISCELLANEOUS) IMPLANT
TOWEL GREEN STERILE FF (TOWEL DISPOSABLE) ×3 IMPLANT
TRAY DSU PREP LF (CUSTOM PROCEDURE TRAY) ×1 IMPLANT
TUBE CONNECTING 20X1/4 (TUBING) ×1 IMPLANT
TUBING INFILTRATION IT-10001 (TUBING) IMPLANT
TUBING SET GRADUATE ASPIR 12FT (MISCELLANEOUS) IMPLANT
UNDERPAD 30X36 HEAVY ABSORB (UNDERPADS AND DIAPERS) ×2 IMPLANT
YANKAUER SUCT BULB TIP NO VENT (SUCTIONS) ×1 IMPLANT

## 2023-09-23 NOTE — Anesthesia Preprocedure Evaluation (Addendum)
 Anesthesia Evaluation  Patient identified by MRN, date of birth, ID band Patient awake    Reviewed: Allergy & Precautions, NPO status , Patient's Chart, lab work & pertinent test results  History of Anesthesia Complications Negative for: history of anesthetic complications  Airway Mallampati: II  TM Distance: >3 FB Neck ROM: Full    Dental  (+) Dental Advisory Given, Teeth Intact   Pulmonary asthma    Pulmonary exam normal        Cardiovascular negative cardio ROS Normal cardiovascular exam     Neuro/Psych negative neurological ROS  negative psych ROS   GI/Hepatic Neg liver ROS,GERD  Controlled,,  Endo/Other  negative endocrine ROS    Renal/GU negative Renal ROS     Musculoskeletal negative musculoskeletal ROS (+)    Abdominal   Peds  Hematology  S/p total splenectomy    Anesthesia Other Findings   Reproductive/Obstetrics  Symptomatic mammary hypertrophy                               Anesthesia Physical Anesthesia Plan  ASA: 2  Anesthesia Plan: General   Post-op Pain Management: Tylenol  PO (pre-op)* and Celebrex PO (pre-op)*   Induction: Intravenous  PONV Risk Score and Plan: 3 and Treatment may vary due to age or medical condition, Ondansetron , Dexamethasone , Midazolam  and Scopolamine  patch - Pre-op  Airway Management Planned: Oral ETT  Additional Equipment: None  Intra-op Plan:   Post-operative Plan: Extubation in OR  Informed Consent: I have reviewed the patients History and Physical, chart, labs and discussed the procedure including the risks, benefits and alternatives for the proposed anesthesia with the patient or authorized representative who has indicated his/her understanding and acceptance.     Dental advisory given  Plan Discussed with: CRNA and Anesthesiologist  Anesthesia Plan Comments:          Anesthesia Quick Evaluation

## 2023-09-23 NOTE — Interval H&P Note (Signed)
 History and Physical Interval Note:  09/23/2023 11:12 AM  Judith [name]  has presented today for surgery, with the diagnosis of Hypertrophy of breast.  The various methods of treatment have been discussed with the patient and family. After consideration of risks, benefits and other options for treatment, the patient has consented to  Procedure(s): BREAST REDUCTION WITH LIPOSUCTION (Bilateral) as a surgical intervention.  The patient's history has been reviewed, patient examined, no change in status, stable for surgery.  I have reviewed the patient's chart and labs.  Questions were answered to the patient's satisfaction.     [name]

## 2023-09-23 NOTE — Op Note (Signed)
 Breast Reduction Op note:    DATE OF PROCEDURE: 09/23/2023  LOCATION: [facility]  SURGEON: Gilles Lacks, DO  ASSISTANT: Mariel Shope, PA  PREOPERATIVE DIAGNOSIS 1. Macromastia 2. Neck Pain 3. Back Pain  POSTOPERATIVE DIAGNOSIS 1. Macromastia 2. Neck Pain 3. Back Pain  PROCEDURES 1. Bilateral breast reduction.  Right reduction 277 g, Left reduction 280 g  COMPLICATIONS: None.  DRAINS: none  INDICATIONS FOR PROCEDURE Judith [name] is a 23 y.o. year-old female born on 02-10-2001,with a history of symptomatic macromastia with concomitant back pain, neck pain, shoulder grooving from her bra.   MRN: [id number]  CONSENT Informed consent was obtained directly from the patient. The risks, benefits and alternatives were fully discussed. Specific risks including but not limited to bleeding, infection, hematoma, seroma, scarring, pain, nipple necrosis, asymmetry, poor cosmetic results, and need for further surgery were discussed. The patient's questions were answered.  DESCRIPTION OF PROCEDURE  Patient was brought into the operating room and rested on the operating room table in the supine position.  SCDs were placed and appropriate padding was performed.  Antibiotics were given. The patient underwent general anesthesia and the chest was prepped and draped in a sterile fashion.  A timeout was performed and all information was confirmed to be correct by those in the room.  Right side: Preoperative markings were confirmed.  Incision lines were injected with local containing epinephrine .  After waiting for vasoconstriction, the marked lines were incised with a #15 blade.  A Wise-pattern superomedial breast reduction was performed by de-epithelializing the pedicle, using bovie to create the superomedial pedicle, and removing breast tissue from the superior, lateral, and inferior portions of the breast.  Care was taken to not undermine the breast pedicle.  Hemostasis was achieved.  Experel and Myriad were placed in the pocket. The nipple was gently rotated into position and the soft tissue closed with 4-0 Monocryl.   The pocket was irrigated and hemostasis confirmed.  The deep tissues were approximated with 3-0 PDS sutures.  The skin was closed with deep dermal 3-0 PDS and subcuticular 4-0 Monocryl sutures.  The nipple and skin flaps had good capillary refill at the end of the procedure.    Left side: Preoperative markings were confirmed.  Incision lines were injected with local containing epinephrine .  After waiting for vasoconstriction, the marked lines were incised with a #15 blade.  A Wise-pattern superomedial breast reduction was performed by de-epithelializing the pedicle, using bovie to create the superomedial pedicle, and removing breast tissue from the superior, lateral, and inferior portions of the breast.  Care was taken to not undermine the breast pedicle. Hemostasis was achieved. Experel and Myriad were placed in the pocket. The nipple was gently rotated into position and the soft tissue was closed with 4-0 Monocryl.  The patient was sat upright and size and shape symmetry was confirmed.  The pocket was irrigated and hemostasis confirmed.  The deep tissues were approximated with 3-0 PDS sutures. The skin was closed with deep dermal 3-0 PDS and subcuticular 4-0 Monocryl sutures.  Dermabond was applied.  A breast binder and ABDs were placed.  The nipple and skin flaps had good capillary refill at the end of the procedure.  The patient tolerated the procedure well. The patient was allowed to wake from anesthesia and taken to the recovery room in satisfactory condition.  The advanced practice practitioner (APP) assisted throughout the case.  The APP was essential in retraction and counter traction when needed to make the  case progress smoothly.  This retraction and assistance made it possible to see the tissue plans for the procedure.  The assistance was  needed for blood control, tissue re-approximation and assisted with closure of the incision site.

## 2023-09-23 NOTE — Discharge Instructions (Addendum)
 INSTRUCTIONS FOR AFTER BREAST SURGERY   You will likely have some questions about what to expect following your operation.  The following information will help you and your family understand what to expect when you are discharged from the hospital.  It is important to follow these guidelines to help ensure a smooth recovery and reduce complication.  Postoperative instructions include information on: diet, wound care, medications and physical activity.  AFTER SURGERY Expect to go home after the procedure.  In some cases, you may need to spend one night in the hospital for observation.  DIET Breast surgery does not require a specific diet.  However, the healthier you eat the better your body will heal. It is important to increasing your protein intake.  This means limiting the foods with sugar and carbohydrates.  Focus on vegetables and some meat.  If you have liposuction during your procedure be sure to drink water .  If your urine is bright yellow, then it is concentrated, and you need to drink more water .  As a general rule after surgery, you should have 8 ounces of water  every hour while awake.  If you find you are persistently nauseated or unable to take in liquids let us  know.  NO TOBACCO USE or EXPOSURE.  This will slow your healing process and lead to a wound.  WOUND CARE Leave the binder on for 3 days . Use fragrance free soap like Dial, Dove or [place].   After 3 days you can remove the binder to shower. Once dry apply binder or sports bra. If you have liposuction you will have a soft and spongy dressing (Lipofoam) that helps prevent creases in your skin.  Remove before you shower and then replace it.  It is also available on [employer]. If you have steri-strips / tape directly attached to your skin leave them in place. It is OK to get these wet.   No baths, pools or hot tubs for four weeks. We close your incision to leave the smallest and best-looking scar. No ointment or creams on your incisions  for four weeks.  No Neosporin (Too many skin reactions).  A few weeks after surgery you can use Mederma and start massaging the scar. We ask you to wear your binder or sports bra for the first 6 weeks around the clock, including while sleeping. This provides added comfort and helps reduce the fluid accumulation at the surgery site. NO Ice or heating pads to the operative site.  You have a very high risk of a BURN before you feel the temperature change.  ACTIVITY No heavy lifting until cleared by the doctor.  This usually means no more than a half-gallon of milk.  It is OK to walk and climb stairs. Moving your legs is very important to decrease your risk of a blood clot.  It will also help keep you from getting deconditioned.  Every 1 to 2 hours get up and walk for 5 minutes. This will help with a quicker recovery back to normal.  Let pain be your guide so you don't do too much.  This time is for you to recover.  You will be more comfortable if you sleep and rest with your head elevated either with a few pillows under you or in a recliner.  No stomach sleeping for a three months.  WORK Everyone returns to work at different times. As a rough guide, most people take at least 1 - 2 weeks off prior to returning to work. If  you need documentation for your job, give the forms to the front staff at the clinic.  DRIVING Arrange for someone to bring you home from the hospital after your surgery.  You may be able to drive a few days after surgery but not while taking any narcotics or valium.  BOWEL MOVEMENTS Constipation can occur after anesthesia and while taking pain medication.  It is important to stay ahead for your comfort.  We recommend taking Milk of Magnesia (2 tablespoons; twice a day) while taking the pain pills.  MEDICATIONS You may be prescribed should start after surgery At your preoperative visit for you history and physical you may have been given the following medications: An antibiotic: Start  this medication when you get home and take according to the instructions on the bottle. Zofran  4 mg:  This is to treat nausea and vomiting.  You can take this every 6 hours as needed and only if needed. Valium 2 mg for breast cancer patients: This is for muscle tightness if you have an implant or expander. This will help relax your muscle which also helps with pain control.  This can be taken every 12 hours as needed. Don't drive after taking this medication. Norco (hydrocodone/acetaminophen ) 5/325 mg:  This is only to be used after you have taken the Motrin  or the Tylenol . Every 8 hours as needed.   Over the counter Medication to take: Ibuprofen  (Motrin ) 600 mg:  Take this every 6 hours.  If you have additional pain then take 500 mg of the Tylenol  every 8 hours.  Only take the Norco after you have tried these two. Next dose of Tylenol  may be given after 4pm. Next dose of Ibuprofen /NSAIDS may be given after 6pm. MiraLAX  or Milk of Magnesia: Take this according to the bottle if you take the Norco.  WHEN TO CALL Call your surgeon's office if any of the following occur: Fever 101 degrees F or greater Excessive bleeding or fluid from the incision site. Pain that increases over time without aid from the medications Redness, warmth, or pus draining from incision sites Persistent nausea or inability to take in liquids Severe misshapen area that underwent the operation.   Post Anesthesia Home Care Instructions  Activity: Get plenty of rest for the remainder of the day. A responsible individual must stay with you for 24 hours following the procedure.  For the next 24 hours, DO NOT: -Drive a car -Advertising copywriter -Drink alcoholic beverages -Take any medication unless instructed by your physician -Make any legal decisions or sign important papers.  Meals: Start with liquid foods such as gelatin or soup. Progress to regular foods as tolerated. Avoid greasy, spicy, heavy foods. If nausea and/or  vomiting occur, drink only clear liquids until the nausea and/or vomiting subsides. Call your physician if vomiting continues.  Special Instructions/Symptoms: Your throat may feel dry or sore from the anesthesia or the breathing tube placed in your throat during surgery. If this causes discomfort, gargle with warm salt water . The discomfort should disappear within 24 hours.  If you had a scopolamine  patch placed behind your ear for the management of post- operative nausea and/or vomiting:  1. The medication in the patch is effective for 72 hours, after which it should be removed.  Wrap patch in a tissue and discard in the trash. Wash hands thoroughly with soap and water . 2. You may remove the patch earlier than 72 hours if you experience unpleasant side effects which may include dry mouth, dizziness or  visual disturbances. 3. Avoid touching the patch. Wash your hands with soap and water  after contact with the patch.   Information for Discharge Teaching: EXPAREL  (bupivacaine  liposome injectable suspension)   Pain relief is important to your recovery. The goal is to control your pain so you can move easier and return to your normal activities as soon as possible after your procedure. Your physician may use several types of medicines to manage pain, swelling, and more.  Your surgeon or anesthesiologist gave you EXPAREL (bupivacaine ) to help control your pain after surgery.  EXPAREL  is a local anesthetic designed to release slowly over an extended period of time to provide pain relief by numbing the tissue around the surgical site. EXPAREL  is designed to release pain medication over time and can control pain for up to 72 hours. Depending on how you respond to EXPAREL , you may require less pain medication during your recovery. EXPAREL  can help reduce or eliminate the need for opioids during the first few days after surgery when pain relief is needed the most. EXPAREL  is not an opioid and is not  addictive. It does not cause sleepiness or sedation.   Important! A teal colored band has been placed on your arm with the date, time and amount of EXPAREL  you have received. Please leave this armband in place for the full 96 hours following administration, and then you may remove the band. If you return to the hospital for any reason within 96 hours following the administration of EXPAREL , the armband provides important information that your health care providers to know, and alerts them that you have received this anesthetic.    Possible side effects of EXPAREL : Temporary loss of sensation or ability to move in the area where medication was injected. Nausea, vomiting, constipation Rarely, numbness and tingling in your mouth or lips, lightheadedness, or anxiety may occur. Call your doctor right away if you think you may be experiencing any of these sensations, or if you have other questions regarding possible side effects.  Follow all other discharge instructions given to you by your surgeon or nurse. Eat a healthy diet and drink plenty of water  or other fluids.

## 2023-09-23 NOTE — Anesthesia Procedure Notes (Signed)
 Procedure Name: Intubation Date/Time: 09/23/2023 11:53 AM  Performed by: Steffani Edman, CRNAPre-anesthesia Checklist: Patient identified, Emergency Drugs available, Suction available and Patient being monitored Patient Re-evaluated:Patient Re-evaluated prior to induction Oxygen Delivery Method: Circle System Utilized Preoxygenation: Pre-oxygenation with 100% oxygen Induction Type: IV induction Ventilation: Mask ventilation without difficulty Laryngoscope Size: Mac and 3 Grade View: Grade I Tube type: Oral Tube size: 7.0 mm Number of attempts: 1 Airway Equipment and Method: Stylet and Oral airway Placement Confirmation: ETT inserted through vocal cords under direct vision, positive ETCO2 and breath sounds checked- equal and bilateral Secured at: 21 cm Tube secured with: Tape Dental Injury: Teeth and Oropharynx as per pre-operative assessment

## 2023-09-23 NOTE — Transfer of Care (Signed)
 Immediate Anesthesia Transfer of Care Note  Patient: Judith [name]  Procedure(s) Performed: Procedure(s) (LRB): BILATERAL BREAST REDUCTION (Bilateral)  Patient Location: PACU  Anesthesia Type: General  Level of Consciousness: awake, oriented, sedated and patient cooperative  Airway & Oxygen Therapy: Patient Spontanous Breathing and Patient connected to face mask oxygen  Post-op Assessment: Report given to PACU RN and Post -op Vital signs reviewed and stable  Post vital signs: Reviewed and stable  Complications: No apparent anesthesia complications Last Vitals:  Vitals Value Taken Time  BP 117/76 09/23/23 1400  Temp    Pulse 98 09/23/23 1402  Resp 21 09/23/23 1402  SpO2 100 % 09/23/23 1402  Vitals shown include unfiled device data.  Last Pain:  Vitals:   09/23/23 1005  TempSrc: Oral  PainSc: 0-No pain      Patients Stated Pain Goal: 8 (09/23/23 1005)  Complications: No notable events documented.

## 2023-09-23 NOTE — Anesthesia Postprocedure Evaluation (Signed)
 Anesthesia Post Note  Patient: Judith [name]  Procedure(s) Performed: BILATERAL BREAST REDUCTION (Bilateral: Breast)     Patient location during evaluation: PACU Anesthesia Type: General Level of consciousness: awake and alert Pain management: pain level controlled Vital Signs Assessment: post-procedure vital signs reviewed and stable Respiratory status: spontaneous breathing, nonlabored ventilation and respiratory function stable Cardiovascular status: stable and blood pressure returned to baseline Anesthetic complications: no   No notable events documented.  Last Vitals:  Vitals:   09/23/23 1430 09/23/23 1448  BP: 114/78 119/76  Pulse: 60 79  Resp: 18 20  Temp:  36.5 C  SpO2: 97% 100%    Last Pain:  Vitals:   09/23/23 1448  TempSrc: Temporal  PainSc: 0-No pain                 Juventino Oppenheim

## 2023-09-24 ENCOUNTER — Encounter (HOSPITAL_BASED_OUTPATIENT_CLINIC_OR_DEPARTMENT_OTHER): Payer: Self-pay | Admitting: Plastic Surgery

## 2023-09-24 LAB — SURGICAL PATHOLOGY

## 2023-09-30 ENCOUNTER — Ambulatory Visit (INDEPENDENT_AMBULATORY_CARE_PROVIDER_SITE_OTHER): Admitting: Student

## 2023-09-30 VITALS — BP 112/77 | HR 89

## 2023-09-30 DIAGNOSIS — N62 Hypertrophy of breast: Secondary | ICD-10-CM

## 2023-09-30 NOTE — Progress Notes (Signed)
 Patient is a 23 year old female who recently underwent bilateral breast reduction with Dr. Orin Birk on 09/23/2023.  She is about 1 week postop.  She presents to the clinic today for postoperative follow-up.  Today, patient reports she is doing well.  She reports that she has a little bit of itching to her breasts.  She states that she had pain for the first few days, but now her pain has subsided and she does not have to take any pain medication.  She reports that she was nauseous for the first few days, but has not had any issues since then.  She reports she is eating and drinking without any issue.  She denies any drainage from either of her breast.  She denies any fever or chills.  She reports she has been up and ambulating without any difficulty.  Chaperone present on exam.  On exam, patient is sitting upright in no acute distress.  Breasts are soft and symmetric.  There is no overlying erythema to either breast.  There are no obvious fluid collections palpated on exam.  NAC's appear to be healthy bilaterally.  Mepilex border dressings are clean dry and intact.  There are no signs of infection on exam.  Discussed with the patient that she should continue with compression at all times and avoid strenuous activities.  Patient to follow back up in about 2 weeks.  I instructed her to call in the meantime she has any questions or concerns about anything.  All of her questions today were answered to her satisfaction.

## 2023-10-01 ENCOUNTER — Encounter: Admitting: Student

## 2023-10-08 ENCOUNTER — Encounter (HOSPITAL_COMMUNITY): Payer: Self-pay

## 2023-10-08 ENCOUNTER — Emergency Department (HOSPITAL_COMMUNITY)
Admission: EM | Admit: 2023-10-08 | Discharge: 2023-10-08 | Disposition: A | Discharge status: Home / Self Care | Attending: Emergency Medicine | Admitting: Emergency Medicine

## 2023-10-08 ENCOUNTER — Other Ambulatory Visit: Payer: Self-pay

## 2023-10-08 DIAGNOSIS — J029 Acute pharyngitis, unspecified: Secondary | ICD-10-CM | POA: Diagnosis present

## 2023-10-08 LAB — GROUP A STREP BY PCR: Group A Strep by PCR: NOT DETECTED

## 2023-10-08 MED ORDER — LIDOCAINE VISCOUS HCL 2 % MT SOLN
15.0000 mL | OROMUCOSAL | 0 refills | Status: AC | PRN
Start: 2023-10-08 — End: ?

## 2023-10-08 MED ORDER — LIDOCAINE VISCOUS HCL 2 % MT SOLN
15.0000 mL | Freq: Once | OROMUCOSAL | Status: AC
  Administered 2023-10-08: 15 mL via OROMUCOSAL
  Filled 2023-10-08: qty 15

## 2023-10-08 MED ORDER — ACETAMINOPHEN 500 MG PO TABS: 500.0000 mg | ORAL_TABLET | Freq: Four times a day (QID) | ORAL | 0 refills | Status: AC | PRN

## 2023-10-08 NOTE — ED Triage Notes (Signed)
 Pt states she has sore throat for 2 days. Denies fevers. Denies flu like sx

## 2023-10-08 NOTE — ED Provider Notes (Signed)
 [place] EMERGENCY DEPARTMENT AT [facility] Provider Note   CSN: [id number] Arrival date & time: 10/08/23  1052     History  Chief Complaint  Patient presents with   Sore Throat    Judith [name] is a 23 y.o. female.  The history is provided by the patient and medical records. No language interpreter was used.  Sore Throat     23 year old female who presents with complaint of sore throat.  Patient report for the past 2 days she has had persistent worsening sore throat that felt similar to prior strep infection that she has had in the past.  She tries drinking tea without adequate relief.  She does not endorse any fever or chills no runny nose sneezing coughing no chest pain or shortness of breath.  Last menstruation was 09/12/2023.  She denies any recent sick contact.  Home Medications Prior to Admission medications   Medication Sig Start Date End Date Taking? Authorizing Provider  ondansetron  (ZOFRAN -ODT) 4 MG disintegrating tablet Take 1 tablet (4 mg total) by mouth every 8 (eight) hours as needed for nausea or vomiting. 09/06/23   Jhonnie Mosher, PA-C  VENTOLIN  HFA 108 (90 Base) MCG/ACT inhaler Inhale 2 puffs into the lungs every 6 (six) hours as needed for wheezing or shortness of breath. 12/04/22   [provider]      Allergies    Vancomycin     Review of Systems   Review of Systems  Constitutional:  Negative for fever.  HENT:  Positive for sore throat.     Physical Exam Updated Vital Signs BP 111/67 (BP Location: Right Arm)   Pulse (!) 104   Temp 98.5 F (36.9 C) (Oral)   Resp 17   Ht 4\' 11"  (1.499 m)   Wt 59 kg   LMP 09/12/2023 (Exact Date) Comment: UPT negative DOS  SpO2 100%   BMI 26.26 kg/m  Physical Exam Vitals and nursing note reviewed.  Constitutional:      General: She is not in acute distress.    Appearance: She is well-developed.  HENT:     Head: Atraumatic.     Mouth/Throat:     Comments: Uvula midline bilateral  tonsillar enlargement at 3+ with exudates noted.  No stridor or trismus.  Normal phonation. Eyes:     Conjunctiva/sclera: Conjunctivae normal.  Cardiovascular:     Rate and Rhythm: Normal rate and regular rhythm.     Pulses: Normal pulses.     Heart sounds: Normal heart sounds.  Pulmonary:     Effort: Pulmonary effort is normal.  Abdominal:     Palpations: Abdomen is soft.     Tenderness: There is no abdominal tenderness.     Comments: No splenomegaly  Musculoskeletal:     Cervical back: Neck supple. No rigidity.  Lymphadenopathy:     Cervical: Cervical adenopathy present.  Skin:    Findings: No rash.  Neurological:     Mental Status: She is alert.  Psychiatric:        Mood and Affect: Mood normal.     ED Results / Procedures / Treatments   Labs (all labs ordered are listed, but only abnormal results are displayed) Labs Reviewed  CULTURE, GROUP A STREP [facility])    EKG None  Radiology No results found.  Procedures Procedures    Medications Ordered in ED Medications  lidocaine  (XYLOCAINE ) 2 % viscous mouth solution 15 mL (15 mLs Mouth/Throat Given 10/08/23 1407)    ED Course/ Medical Decision  Making/ A&P                                 Medical Decision Making  BP 111/67 (BP Location: Right Arm)   Pulse (!) 104   Temp 98.5 F (36.9 C) (Oral)   Resp 17   Ht 4\' 11"  (1.499 m)   Wt 59 kg   LMP 09/12/2023 (Exact Date) Comment: UPT negative DOS  SpO2 100%   BMI 26.26 kg/m   40:3 PM  23 year old female who presents with complaint of sore throat.  Patient report for the past 2 days she has had persistent worsening sore throat that felt similar to prior strep infection that she has had in the past.  She tries drinking tea without adequate relief.  She does not endorse any fever or chills no runny nose sneezing coughing no chest pain or shortness of breath.  Last menstruation was 09/12/2023.  She denies any recent sick contact.  On exam patient is resting company in  bed appears to be in no acute discomfort.  Throat exam remarkable for bilateral tonsillar lodgment with exudates but no obvious peritonsillar abscess and patient has normal phonation.  Uvula is midline.  Strep test obtained independent view interpreted by me and it is negative.  Suspect her pharyngitis is likely viral in etiology.  Will prescribe medication for supportive care and will give patient return precaution.  I have low suspicion for peritonsillar abscess, retropharyngeal abscess or other deep tissue infection.  I recommend patient avoid any activities that can cause injury to the abdomen as his symptoms could be related to mononucleosis.  Patient exhibiting improvement of symptoms with viscous lidocaine .  She is stable for discharge.  Advanced imaging including neck soft tissue CT considered but not performed as I have low suspicion for deep infection.        Final Clinical Impression(s) / ED Diagnoses Final diagnoses:  Pharyngitis, unspecified etiology    Rx / DC Orders ED Discharge Orders          Ordered    lidocaine  (XYLOCAINE ) 2 % solution  As needed        10/08/23 1508    acetaminophen  (TYLENOL ) 500 MG tablet  Every 6 hours PRN        10/08/23 1508              Debbra Fairy, PA-C 10/08/23 1509    Lind Repine, MD 10/09/23 1457

## 2023-10-08 NOTE — Discharge Instructions (Addendum)
 Your strep test is negative.  Your symptoms likely due to a viral pharyngitis such as mononucleosis.  Take medication prescribed as needed for your symptoms but return if you have worsening symptoms or if you have other concern.  Avoid any activities that can cause direct impact to your abdomen as sometimes mononucleosis can cause enlarged spleen which can rupture.

## 2023-10-12 ENCOUNTER — Encounter: Payer: Self-pay | Admitting: Plastic Surgery

## 2023-10-12 ENCOUNTER — Ambulatory Visit: Admitting: Plastic Surgery

## 2023-10-12 VITALS — BP 109/74 | HR 93

## 2023-10-12 DIAGNOSIS — N62 Hypertrophy of breast: Secondary | ICD-10-CM

## 2023-10-12 NOTE — Progress Notes (Signed)
 The patient is a 23 year old female here for follow-up after undergoing bilateral breast reduction.  Overall she is doing really well she has a very nice reduction in the left.  No sign of a hematoma or seroma.  She says she is moving her bowels and eating without difficulty.  No pain complaints.  Follow-up in 2 weeks. Pictures were obtained of the patient and placed in the chart with the patient's or guardian's permission.

## 2023-10-25 NOTE — Progress Notes (Unsigned)
 Patient is a pleasant 23 year old female s/p bilateral breast reduction 09/24/2023 Dr. Lowery who presents to clinic for postoperative follow-up.  She was last seen here in clinic on 10/12/2023.  At that time, she was doing really well.  Exam was benign.  Pictures were obtained and placed in chart.  Follow-up in 2 weeks.  Per operative report, approximately 280 g was removed from each breast.  Today, patient is doing really well.  She states that her sore throat that she had been experiencing earlier on during her postoperative recovery requiring ER encounter has resolved.  Possibly attributable to LMA/surgery.  She is exceedingly pleased with her breasts.  Not only that she liked the new size, but it has improved her upper back/neck discomfort.  She denies any leg swelling and has been ambulatory at home.  Tolerating p.o. intake without difficulty, voiding.  Denies any chest pain, fevers, or difficulty breathing.  On exam, breasts have excellent shape and symmetry.  NAC's are healthy and viable, warm.  Breasts are soft throughout and without any palpable areas of firmness or underlying fluid collections.  Steri-Strips are all gently removed without complication.  Mild skin irritation, but no tearing or erythema.  Incisions are CDI throughout.  Recommending Vaseline or Aquaphor to the incisions and breasts once daily.  Continue with activity modifications and compressive garments.  Follow-up for final postoperative encounter, as scheduled.  Will likely obtain additional photos at that time.  She can call the office if she having questions or concerns in interim.

## 2023-10-26 ENCOUNTER — Ambulatory Visit (INDEPENDENT_AMBULATORY_CARE_PROVIDER_SITE_OTHER): Admitting: Physician Assistant

## 2023-10-26 VITALS — BP 104/72 | HR 85

## 2023-10-26 DIAGNOSIS — Z9889 Other specified postprocedural states: Secondary | ICD-10-CM

## 2023-11-01 NOTE — Progress Notes (Unsigned)
 Patient is a pleasant 23 year old female s/p bilateral breast reduction 09/24/2023 Dr. Lowery who presents to clinic for postoperative follow-up.   She was seen most recently here in clinic on 10/26/2023.  At that time, exam was entirely benign.  Recommended petroleum products to the incisions throughout and follow-up as scheduled.  Today, she is doing well.  She is exceedingly pleased with the outcome of her breast reduction surgery, both from a aesthetic standpoint as well as a functional standpoint.  She states that her back pain/neck pain that she attributed to macromastia is fully resolved.  She still endorses mild amount of residual skin glue, but otherwise is doing excellent from a postoperative standpoint and is without any concerns.  On exam, breasts have excellent shape and symmetry.  Soft throughout.  Nontender.  NAC's are healthy.  Mild to moderate residual Dermabond around the NAC's, but vertical limbs are spared.  No incisional wounds noted and she is healing quite nicely.  Recommending transition to a silicone scar gel twice daily x 3 months once she is free of any residual skin glue.  She may increase activity as tolerated and discontinue the compressive garments.  Cleared from a postoperative standpoint.  Follow-up only as needed.  Picture(s) obtained of the patient and placed in the chart were with the patient's or guardian's permission.

## 2023-11-02 ENCOUNTER — Ambulatory Visit (INDEPENDENT_AMBULATORY_CARE_PROVIDER_SITE_OTHER): Admitting: Physician Assistant

## 2023-11-02 DIAGNOSIS — Z9889 Other specified postprocedural states: Secondary | ICD-10-CM

## 2023-11-29 ENCOUNTER — Ambulatory Visit (INDEPENDENT_AMBULATORY_CARE_PROVIDER_SITE_OTHER): Admitting: Physician Assistant

## 2023-11-29 VITALS — BP 112/77 | HR 97

## 2023-11-29 DIAGNOSIS — Z9889 Other specified postprocedural states: Secondary | ICD-10-CM

## 2023-11-29 NOTE — Progress Notes (Signed)
 Patient is a pleasant 23 year old female s/p bilateral breast reduction 09/24/2023 Dr. Lowery who presents to clinic for postoperative follow-up.   She was last seen here in clinic on 11/02/2023.  At that time, she had a moderate amount of residual overlying Dermabond.  However, her exam was otherwise benign.  Cleared from a postoperative standpoint.  She reports that yesterday she felt a burning searing discomfort underneath the left breast while in the shower.  She noticed that she has a small opening and wanted to be evaluated.  She denies any other symptoms or concerns.  She states that the silicone scar gel has been helpful.  She denies any ongoing back or neck discomfort since the reduction and is overall quite pleased with results of her surgery.  On exam, breasts have excellent shape and symmetry.  Soft throughout.  0.75 cm incisional wound at the most left lateral aspect of left inframammary incision.  No significant depth.  No expressible drainage.  No surrounding erythema or induration.  No palpable underlying fluid collections.  No crepitus or fluctuance.  Recommending Vaseline followed by a bordered Mepilex dressing to help mitigate risk of friction injury.  It may have been the result of a suture abscess spontaneously rupturing.  Regardless, suspect that it will heal without complication or difficulty.  No evidence concerning for infection and she does not need follow-up unless it fails to improve within the next 2 to 3 weeks.  She understands and needs to heal via secondary intent.  Picture(s) obtained of the patient and placed in the chart were with the patient's or guardian's permission.

## 2024-02-15 ENCOUNTER — Ambulatory Visit
Admission: EM | Admit: 2024-02-15 | Discharge: 2024-02-15 | Disposition: A | Discharge status: Home / Self Care | Attending: Family Medicine | Admitting: Family Medicine

## 2024-02-15 ENCOUNTER — Other Ambulatory Visit: Payer: Self-pay

## 2024-02-15 DIAGNOSIS — H1032 Unspecified acute conjunctivitis, left eye: Secondary | ICD-10-CM | POA: Diagnosis not present

## 2024-02-15 MED ORDER — POLYMYXIN B-TRIMETHOPRIM 10000-0.1 UNIT/ML-% OP SOLN: 1.0000 [drp] | OPHTHALMIC | 0 refills | Status: AC
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
## 2024-02-15 NOTE — ED Triage Notes (Signed)
 Pt c/o left eye reddness started yesterday upon waking from nap. Pt states her eye was crusted when she woke up. The sclera of pt's left eye is erythematous

## 2024-02-15 NOTE — Discharge Instructions (Addendum)
 Start the antibiotic eyedrops as prescribed.  Warm compresses to the eye as needed.  If your symptoms are not improving in a couple of days you may stop this and start over-the-counter antihistamine eyedrop such as Clear Eyes.  Follow-up with your PCP in 2 to 3 days for recheck.  Please go to the ER for any worsening symptoms.  Hope you feel better soon!

## 2024-03-12 ENCOUNTER — Ambulatory Visit
Admission: EM | Admit: 2024-03-12 | Discharge: 2024-03-12 | Disposition: A | Discharge status: Home / Self Care | Attending: Family Medicine | Admitting: Family Medicine

## 2024-03-12 DIAGNOSIS — B354 Tinea corporis: Secondary | ICD-10-CM

## 2024-03-12 MED ORDER — KETOCONAZOLE 2 % EX CREA: 1.0000 | TOPICAL_CREAM | Freq: Every day | CUTANEOUS | 0 refills | Status: AC

## 2024-03-12 NOTE — ED Triage Notes (Signed)
 Patient c/o rash to right forearm x 1 week. Pt stated that she is not experiencing any itching,burning or pain to the affected  area. Pt stated that she used Neosporin and Vaseline which were not effective.
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
## 2024-04-01 ENCOUNTER — Ambulatory Visit
Admission: EM | Admit: 2024-04-01 | Discharge: 2024-04-01 | Disposition: A | Discharge status: Home / Self Care | Attending: Family Medicine | Admitting: Family Medicine

## 2024-04-01 ENCOUNTER — Other Ambulatory Visit: Payer: Self-pay

## 2024-04-01 DIAGNOSIS — J4520 Mild intermittent asthma, uncomplicated: Secondary | ICD-10-CM

## 2024-04-01 DIAGNOSIS — J069 Acute upper respiratory infection, unspecified: Secondary | ICD-10-CM | POA: Diagnosis not present

## 2024-04-01 MED ORDER — PREDNISONE 20 MG PO TABS: 40.0000 mg | ORAL_TABLET | Freq: Every day | ORAL | 0 refills | Status: AC

## 2024-04-01 MED ORDER — PROMETHAZINE-DM 6.25-15 MG/5ML PO SYRP: 5.0000 mL | ORAL_SOLUTION | Freq: Four times a day (QID) | ORAL | 0 refills | Status: AC | PRN

## 2024-04-01 NOTE — Discharge Instructions (Signed)
 Continue your albuterol  inhaler as needed.  Start prednisone daily for 5 days.  You may take Promethazine  DM as needed for your cough.  Please note this make you drowsy.  Do not drink alcohol or drive on this medication.  Lots of rest and fluids.  Please follow-up with your PCP if your symptoms do not improve.  Please go to the ER for any worsening symptoms.  Hope you feel better soon!
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
## 2024-04-01 NOTE — ED Triage Notes (Signed)
 Pt c/o productive cough w.yellow mucous, nasal congestion, bodyaches, chest congestion, SOB, chills, sinus painx4d.
